# Patient Record
Sex: Female | Born: 1975 | Race: White | Hispanic: No | Marital: Married | Smoking: Current every day smoker
Health system: Southern US, Community
[De-identification: ages and names within clinical notes are randomized; demographics above are authoritative.]

## PROBLEM LIST (undated history)

## (undated) HISTORY — PX: ABDOMINAL HYSTERECTOMY: SHX81

## (undated) HISTORY — PX: PARTIAL HYSTERECTOMY: SHX80

## (undated) HISTORY — PX: KNEE SURGERY: SHX244

---

## 2007-01-08 ENCOUNTER — Emergency Department: Payer: Self-pay | Admitting: Emergency Medicine

## 2007-04-24 ENCOUNTER — Inpatient Hospital Stay: Payer: Self-pay | Admitting: Specialist

## 2007-05-21 ENCOUNTER — Encounter: Payer: Self-pay | Admitting: Specialist

## 2007-05-23 ENCOUNTER — Encounter: Payer: Self-pay | Admitting: Specialist

## 2007-06-23 ENCOUNTER — Encounter: Payer: Self-pay | Admitting: Specialist

## 2007-07-23 ENCOUNTER — Encounter: Payer: Self-pay | Admitting: Specialist

## 2007-08-23 ENCOUNTER — Encounter: Payer: Self-pay | Admitting: Specialist

## 2007-09-04 ENCOUNTER — Emergency Department: Payer: Self-pay | Admitting: Emergency Medicine

## 2007-09-22 ENCOUNTER — Encounter: Payer: Self-pay | Admitting: Specialist

## 2007-10-23 ENCOUNTER — Encounter: Payer: Self-pay | Admitting: Specialist

## 2008-06-16 ENCOUNTER — Emergency Department: Payer: Self-pay | Admitting: Internal Medicine

## 2009-01-19 ENCOUNTER — Ambulatory Visit: Payer: Self-pay | Admitting: Obstetrics and Gynecology

## 2009-01-31 ENCOUNTER — Ambulatory Visit: Payer: Self-pay | Admitting: Obstetrics and Gynecology

## 2017-07-02 ENCOUNTER — Emergency Department: Payer: Self-pay

## 2017-07-02 ENCOUNTER — Emergency Department
Admission: EM | Admit: 2017-07-02 | Discharge: 2017-07-02 | Disposition: A | Discharge status: Home / Self Care | Payer: Self-pay | Attending: Emergency Medicine | Admitting: Emergency Medicine

## 2017-07-02 ENCOUNTER — Encounter: Payer: Self-pay | Admitting: *Deleted

## 2017-07-02 DIAGNOSIS — F17200 Nicotine dependence, unspecified, uncomplicated: Secondary | ICD-10-CM | POA: Insufficient documentation

## 2017-07-02 DIAGNOSIS — R1084 Generalized abdominal pain: Secondary | ICD-10-CM

## 2017-07-02 DIAGNOSIS — K5732 Diverticulitis of large intestine without perforation or abscess without bleeding: Secondary | ICD-10-CM

## 2017-07-02 LAB — COMPREHENSIVE METABOLIC PANEL
ALBUMIN: 4.3 g/dL (ref 3.5–5.0)
ALK PHOS: 60 U/L (ref 38–126)
ALT: 40 U/L (ref 14–54)
ANION GAP: 11 (ref 5–15)
AST: 32 U/L (ref 15–41)
BILIRUBIN TOTAL: 0.6 mg/dL (ref 0.3–1.2)
BUN: 12 mg/dL (ref 6–20)
CALCIUM: 9.7 mg/dL (ref 8.9–10.3)
CO2: 27 mmol/L (ref 22–32)
Chloride: 99 mmol/L — ABNORMAL LOW (ref 101–111)
Creatinine, Ser: 0.65 mg/dL (ref 0.44–1.00)
GFR calc non Af Amer: >60 mL/min (ref >60)
GLUCOSE: 170 mg/dL — AB (ref 65–99)
POTASSIUM: 4 mmol/L (ref 3.5–5.1)
SODIUM: 137 mmol/L (ref 135–145)
TOTAL PROTEIN: 7.7 g/dL (ref 6.5–8.1)

## 2017-07-02 LAB — URINALYSIS, COMPLETE (UACMP) WITH MICROSCOPIC
BACTERIA UA: NONE SEEN
BILIRUBIN URINE: NEGATIVE
GLUCOSE, UA: NEGATIVE mg/dL
HGB URINE DIPSTICK: NEGATIVE
KETONES UR: 5 mg/dL — AB
LEUKOCYTES UA: NEGATIVE
NITRITE: NEGATIVE
PROTEIN: NEGATIVE mg/dL
Specific Gravity, Urine: 1.023 (ref 1.005–1.030)
pH: 6 (ref 5.0–8.0)

## 2017-07-02 LAB — CBC
HEMATOCRIT: 39.1 % (ref 35.0–47.0)
HEMOGLOBIN: 13.1 g/dL (ref 12.0–16.0)
MCH: 30.8 pg (ref 26.0–34.0)
MCHC: 33.5 g/dL (ref 32.0–36.0)
MCV: 91.9 fL (ref 80.0–100.0)
Platelets: 238 10*3/uL (ref 150–440)
RBC: 4.25 MIL/uL (ref 3.80–5.20)
RDW: 17.7 % — ABNORMAL HIGH (ref 11.5–14.5)
WBC: 12.5 10*3/uL — ABNORMAL HIGH (ref 3.6–11.0)

## 2017-07-02 LAB — LIPASE, BLOOD: Lipase: 14 U/L (ref 11–51)

## 2017-07-02 LAB — POCT PREGNANCY, URINE: PREG TEST UR: NEGATIVE

## 2017-07-02 MED ORDER — KETOROLAC TROMETHAMINE 30 MG/ML IJ SOLN
15.0000 mg | INTRAMUSCULAR | Status: AC
  Administered 2017-07-02: 15 mg via INTRAVENOUS
  Filled 2017-07-02: qty 1

## 2017-07-02 MED ORDER — SODIUM CHLORIDE 0.9 % IV BOLUS (SEPSIS)
1000.0000 mL | Freq: Once | INTRAVENOUS | Status: AC
  Administered 2017-07-02: 1000 mL via INTRAVENOUS

## 2017-07-02 MED ORDER — IOPAMIDOL (ISOVUE-300) INJECTION 61%
100.0000 mL | Freq: Once | INTRAVENOUS | Status: AC | PRN
  Administered 2017-07-02: 100 mL via INTRAVENOUS
  Filled 2017-07-02: qty 100

## 2017-07-02 MED ORDER — METRONIDAZOLE 500 MG PO TABS: 500.0000 mg | ORAL_TABLET | Freq: Three times a day (TID) | ORAL | 0 refills | Status: DC

## 2017-07-02 MED ORDER — CIPROFLOXACIN HCL 500 MG PO TABS: 500.0000 mg | ORAL_TABLET | Freq: Two times a day (BID) | ORAL | 0 refills | Status: DC

## 2017-07-02 MED ORDER — ONDANSETRON 4 MG PO TBDP: 4.0000 mg | ORAL_TABLET | Freq: Three times a day (TID) | ORAL | 0 refills | Status: DC | PRN

## 2017-07-02 NOTE — ED Triage Notes (Signed)
PT to ED reporting left sided abd and flank pain since Sunday. PT reports believing she had an ovarian cyst 2 weeks ago that improved with time until pt reports having had sex with her husband on Sunday. PT reports after that she has had 7/10 constant pain. No NVD or fevers reported at home, no vaginal discharge. PT reports tenderness to the upper left abd.

## 2017-07-02 NOTE — ED Notes (Signed)
Pt reports pain left upper abdomen and pressure in lower abdomen.  No n/v/d.  Sx for 2 weeks.  Sx worse 3 days ago after intercourse.  No vag bleeding no dysuria.  Pt alert.

## 2017-07-02 NOTE — ED Provider Notes (Signed)
[facility] Emergency Department Provider Note  ____________________________________________  Time seen: Approximately 8:07 PM  I have reviewed the triage vital signs and the nursing notes.   HISTORY  Chief Complaint Abdominal Pain    HPI Alexis Zhang is a 41 y.o. female who complains of diffuse left-sided abdominal pain that has been somewhat gradual and mild for the past 2 weeks, but worse over the past 2-3 days. No nausea vomiting or diarrhea. No rectal bleeding. No fevers or chills. Radiates to the back, worse with movement, better with staying still. Concerned that it could be an ovarian cyst. No vaginal bleeding or discharge. Feels sharp.     History reviewed. No pertinent past medical history.   There are no active problems to display for this patient.    History reviewed. No pertinent surgical history.   Prior to Admission medications   Medication Sig Start Date End Date Taking? Authorizing Provider  ciprofloxacin (CIPRO) 500 MG tablet Take 1 tablet (500 mg total) by mouth 2 (two) times daily. 07/02/17   Sharman Cheek, MD  metroNIDAZOLE (FLAGYL) 500 MG tablet Take 1 tablet (500 mg total) by mouth 3 (three) times daily. 07/02/17   Sharman Cheek, MD  ondansetron (ZOFRAN ODT) 4 MG disintegrating tablet Take 1 tablet (4 mg total) by mouth every 8 (eight) hours as needed for nausea or vomiting. 07/02/17   Sharman Cheek, MD     Allergies Patient has no allergy information on record.   History reviewed. No pertinent family history.  Social History Social History  Substance Use Topics  . Smoking status: Current Every Day Smoker    Packs/day: 0.50  . Smokeless tobacco: Never Used  . Alcohol use No    Review of Systems  Constitutional:   No fever or chills.  ENT:   No sore throat. No rhinorrhea. Cardiovascular:   No chest pain or syncope. Respiratory:   No dyspnea or cough. Gastrointestinal:   positive as above for  abdominal pain without vomiting and diarrhea.  Musculoskeletal:   Negative for focal pain or swelling All other systems reviewed and are negative except as documented above in ROS and HPI.  ____________________________________________   PHYSICAL EXAM:  VITAL SIGNS: ED Triage Vitals  Enc Vitals Group     BP 07/02/17 1648 (!) 152/102     Pulse Rate 07/02/17 1648 (!) 124     Resp 07/02/17 1648 18     Temp 07/02/17 1648 97.7 F (36.5 C)     Temp Source 07/02/17 1648 Oral     SpO2 07/02/17 1648 96 %     Weight 07/02/17 1649 285 lb (129.3 kg)     Height 07/02/17 1649 5' 11.5" (1.816 m)     Head Circumference --      Peak Flow --      Pain Score 07/02/17 1648 7     Pain Loc --      Pain Edu? --      Excl. in GC? --     Vital signs reviewed, nursing assessments reviewed.   Constitutional:   Alert and oriented. Well appearing and in no distress. Eyes:   No scleral icterus.  EOMI. No nystagmus. No conjunctival pallor. PERRL. ENT   Head:   Normocephalic and atraumatic.   Nose:   No congestion/rhinnorhea.    Mouth/Throat:   MMM, no pharyngeal erythema. No peritonsillar mass.    Neck:   No meningismus. Full ROM Hematological/Lymphatic/Immunilogical:   No cervical lymphadenopathy. Cardiovascular:  RRR. Symmetric bilateral radial and DP pulses.  No murmurs.  Respiratory:   Normal respiratory effort without tachypnea/retractions. Breath sounds are clear and equal bilaterally. No wheezes/rales/rhonchi. Gastrointestinal:   Soft with diffuse left-sided abdominal tenderness. Some suprapubic tenderness as well.. Non distended. There is no CVA tenderness.  No rebound, rigidity, or guarding. Genitourinary:   deferred Musculoskeletal:   Normal range of motion in all extremities. No joint effusions.  No lower extremity tenderness.  No edema. Neurologic:   Normal speech and language.  Motor grossly intact. No gross focal neurologic deficits are appreciated.  Skin:    Skin is warm,  dry and intact. No rash noted.  No petechiae, purpura, or bullae.  ____________________________________________    LABS (pertinent positives/negatives) (all labs ordered are listed, but only abnormal results are displayed) Labs Reviewed  COMPREHENSIVE METABOLIC PANEL - Abnormal; Notable for the following:       Result Value   Chloride 99 (*)    Glucose, Bld 170 (*)    All other components within normal limits  CBC - Abnormal; Notable for the following:    WBC 12.5 (*)    RDW 17.7 (*)    All other components within normal limits  URINALYSIS, COMPLETE (UACMP) WITH MICROSCOPIC - Abnormal; Notable for the following:    Color, Urine YELLOW (*)    APPearance CLEAR (*)    Ketones, ur 5 (*)    Squamous Epithelial / LPF 0-5 (*)    All other components within normal limits  LIPASE, BLOOD  POCT PREGNANCY, URINE   ____________________________________________   EKG    ____________________________________________    RADIOLOGY  Ct Abdomen Pelvis W Contrast  Result Date: 07/02/2017 CLINICAL DATA:  Abdominal pain left flank pain. Ovarian cyst removed 2 weeks ago EXAM: CT ABDOMEN AND PELVIS WITH CONTRAST TECHNIQUE: Multidetector CT imaging of the abdomen and pelvis was performed using the standard protocol following bolus administration of intravenous contrast. CONTRAST:  ISOVUE-300 IOPAMIDOL (ISOVUE-300) INJECTION 61% COMPARISON:  CT abdomen pelvis 01/08/2007 FINDINGS: Lower chest: Lung bases clear Hepatobiliary: Extensive fatty infiltration liver with hepatomegaly. Gallbladder and bile ducts normal Pancreas: Negative Spleen: Negative Adrenals/Urinary Tract: Negative Stomach/Bowel: Mild edema surrounding the sigmoid colon. Scattered small diverticular seen in the area in the findings most consistent with mild acute diverticulitis. No evidence of abscess. Negative for bowel obstruction.  Normal appendix Vascular/Lymphatic: Negative Reproductive: Hysterectomy. No adnexal mass. No free  fluid. Both ovaries are noted in normal in size. Other: None Musculoskeletal: Disc degeneration and spurring L4-5 IMPRESSION: Findings consistent with mild diverticulitis of the sigmoid colon. Negative for abscess. Fatty infiltration throughout the liver with hepatomegaly Electronically Signed   By: Marlan Palau M.D.   On: 07/02/2017 19:09    ____________________________________________   PROCEDURES Procedures  ____________________________________________   INITIAL IMPRESSION / ASSESSMENT AND PLAN / ED COURSE  Pertinent labs & imaging results that were available during my care of the patient were reviewed by me and considered in my medical decision making (see chart for details).  patient well appearing no acute distress. Tachycardic to 120 in triage, normal on my exam. Nontoxic, not septic. Concern for kidney stone versus diverticulitis versus bowel obstruction or internal hernia versus possibly ovarian cyst. Low suspicion of torsion. CT scan was performed which does show mild diverticulitis. On reassessment at 8:00 PM, patient remains well appearing in good spirits and smiling. Counseled on taking ibuprofen and Tylenol for pain relief. Offered very limited prescription of opioid, patient declines. Recommended follow-up with primary care,  follow-up with general surgery if she has recurrences of these symptoms or if it does not resolvedwithin one week.return precautions for worsening of symptoms.  At this point I doubtSTI PID TOA torsion biliary disease AAA obstruction perforation or urinary tract infection. Abdomen is nonsurgical.      ____________________________________________   FINAL CLINICAL IMPRESSION(S) / ED DIAGNOSES  Final diagnoses:  Generalized abdominal pain  Diverticulitis of large intestine without perforation or abscess, unspecified bleeding status      New Prescriptions   CIPROFLOXACIN (CIPRO) 500 MG TABLET    Take 1 tablet (500 mg total) by mouth 2 (two) times  daily.   METRONIDAZOLE (FLAGYL) 500 MG TABLET    Take 1 tablet (500 mg total) by mouth 3 (three) times daily.   ONDANSETRON (ZOFRAN ODT) 4 MG DISINTEGRATING TABLET    Take 1 tablet (4 mg total) by mouth every 8 (eight) hours as needed for nausea or vomiting.     Portions of this note were generated with dragon dictation software. Dictation errors may occur despite best attempts at proofreading.    Sharman CheekStafford, Pricilla Moehle, MD 07/02/17 2012

## 2020-11-17 ENCOUNTER — Encounter: Payer: Self-pay | Admitting: Family Medicine

## 2020-11-17 ENCOUNTER — Other Ambulatory Visit: Payer: Self-pay

## 2020-11-17 ENCOUNTER — Ambulatory Visit: Payer: 59 | Admitting: Family Medicine

## 2020-11-17 VITALS — BP 137/95 | HR 90 | Ht 72.0 in | Wt 263.7 lb

## 2020-11-17 DIAGNOSIS — R030 Elevated blood-pressure reading, without diagnosis of hypertension: Secondary | ICD-10-CM

## 2020-11-17 DIAGNOSIS — K5792 Diverticulitis of intestine, part unspecified, without perforation or abscess without bleeding: Secondary | ICD-10-CM | POA: Insufficient documentation

## 2020-11-17 DIAGNOSIS — F5101 Primary insomnia: Secondary | ICD-10-CM | POA: Diagnosis not present

## 2020-11-17 DIAGNOSIS — Z720 Tobacco use: Secondary | ICD-10-CM | POA: Diagnosis not present

## 2020-11-17 DIAGNOSIS — Z7689 Persons encountering health services in other specified circumstances: Secondary | ICD-10-CM | POA: Diagnosis not present

## 2020-11-17 DIAGNOSIS — E66812 Obesity, class 2: Secondary | ICD-10-CM | POA: Insufficient documentation

## 2020-11-17 DIAGNOSIS — E6609 Other obesity due to excess calories: Secondary | ICD-10-CM

## 2020-11-17 DIAGNOSIS — Z6835 Body mass index (BMI) 35.0-35.9, adult: Secondary | ICD-10-CM

## 2020-11-17 DIAGNOSIS — R252 Cramp and spasm: Secondary | ICD-10-CM

## 2020-11-17 MED ORDER — TRAZODONE HCL 50 MG PO TABS: 25.0000 mg | ORAL_TABLET | Freq: Every evening | ORAL | 1 refills | Status: AC | PRN

## 2020-11-17 NOTE — Progress Notes (Signed)
New Patient Office Visit  Subjective:  Subjective  Patient ID: Alexis Zhang, female    DOB: Jul 28, 1976  Age: 45 y.o. MRN: [id number]  CC:  Chief Complaint  Patient presents with  . New Patient (Initial Visit)    Patient is here to establish care with a new pcp.    HPI Alexis Zhang is a 45 y.o. female presenting today for a new patient evaluation.     History reviewed. No pertinent past medical history.  Past Surgical History:  Procedure Laterality Date  . KNEE SURGERY    . PARTIAL HYSTERECTOMY      Family History  Problem Relation Age of Onset  . Heart disease Mother   . Hypertension Mother   . Thyroid disease Mother   . Diabetes Father   . Diabetes Brother   . Diabetes Maternal Grandfather   . Diabetes Paternal Grandmother     Social History   Socioeconomic History  . Marital status: Married    Spouse name: Not on file  . Number of children: Not on file  . Years of education: Not on file  . Highest education level: Not on file  Occupational History  . Not on file  Tobacco Use  . Smoking status: Current Every Day Smoker    Packs/day: 0.50    Years: 20.00    Pack years: 10.00  . Smokeless tobacco: Never Used  Substance and Sexual Activity  . Alcohol use: No  . Drug use: No  . Sexual activity: Yes  Other Topics Concern  . Not on file  Social History Narrative  . Not on file   Social Determinants of Health   Financial Resource Strain: Not on file  Food Insecurity: Not on file  Transportation Needs: Not on file  Physical Activity: Not on file  Stress: Not on file  Social Connections: Not on file  Intimate Partner Violence: Not on file     Current Outpatient Medications:  .  acetaminophen (TYLENOL) 500 MG tablet, Take 500 mg by mouth daily as needed., Disp: , Rfl:  .  traZODone (DESYREL) 50 MG tablet, Take 0.5-1 tablets (25-50 mg total) by mouth at bedtime as needed for sleep., Disp: 90 tablet, Rfl: 1   No Known  Allergies  ROS Review of Systems  Constitutional: Negative.   HENT: Negative.   Respiratory: Negative.   Cardiovascular: Negative.   Gastrointestinal: Positive for diarrhea.  Endocrine: Negative.   Genitourinary: Negative.   Musculoskeletal:       Frequent muscle cramps.   Neurological: Negative.   Hematological: Negative.   Psychiatric/Behavioral: Negative.       Objective:    Physical Exam Constitutional:      Appearance: She is obese.  HENT:     Head: Normocephalic.     Right Ear: Tympanic membrane normal.     Left Ear: Tympanic membrane normal.     Nose: Nose normal.     Mouth/Throat:     Mouth: Mucous membranes are dry.  Eyes:     Pupils: Pupils are equal, round, and reactive to light.  Cardiovascular:     Rate and Rhythm: Normal rate.     Pulses: Normal pulses.  Pulmonary:     Effort: Pulmonary effort is normal.  Abdominal:     General: Abdomen is flat.  Musculoskeletal:     Cervical back: Normal range of motion.  Skin:    General: Skin is warm.     Capillary Refill: Capillary refill takes less  than 2 seconds.  Neurological:     General: No focal deficit present.     Mental Status: She is alert.  Psychiatric:        Mood and Affect: Mood normal.     BP (!) 137/95   Pulse 90   Ht 6' (1.829 m)   Wt 263 lb 11.2 oz (119.6 kg)   BMI 35.76 kg/m  Wt Readings from Last 3 Encounters:  11/17/20 263 lb 11.2 oz (119.6 kg)  07/02/17 285 lb (129.3 kg)    Health Maintenance Due  Topic Date Due  . Hepatitis C Screening  Never done  . COVID-19 Vaccine (1) Never done  . HIV Screening  Never done  . TETANUS/TDAP  Never done  . INFLUENZA VACCINE  Never done    There are no preventive care reminders to display for this patient.  Laboratory Data: I have reviewed this information for accuracy. CBC Latest Ref Rng & Units 07/02/2017  WBC 3.6 - 11.0 K/uL 12.5(H)  Hemoglobin 12.0 - 16.0 g/dL 19.1  Hematocrit 47.8 - 47.0 % 39.1  Platelets 150 - 440 K/uL 238    CMP Latest Ref Rng & Units 07/02/2017  Glucose 65 - 99 mg/dL 295(A)  BUN 6 - 20 mg/dL 12  Creatinine 2.13 - 0.86 mg/dL 5.78  Sodium 469 - 629 mmol/L 137  Potassium 3.5 - 5.1 mmol/L 4.0  Chloride 101 - 111 mmol/L 99(L)  CO2 22 - 32 mmol/L 27  Calcium 8.9 - 10.3 mg/dL 9.7  Total Protein 6.5 - 8.1 g/dL 7.7  Total Bilirubin 0.3 - 1.2 mg/dL 0.6  Alkaline Phos 38 - 126 U/L 60  AST 15 - 41 U/L 32  ALT 14 - 54 U/L 40    No results found for: TSH Lab Results  Component Value Date   ALBUMIN 4.3 07/02/2017   ANIONGAP 11 07/02/2017   No results found for: CHOL, HDL, LDLCALC, CHOLHDL No results found for: TRIG No results found for: HGBA1C    Assessment & Plan:   Problem List Items Addressed This Visit      Other   Establishing care with new doctor, encounter for - Primary    Patient has not had a PCP in years she says. She came in today to discuss some recent medical issues, Insomnia, Frequent loose BM, Muscle cramps, dx of Diverticulitis at the ER months ago without infection. She smokes 1/2 ppd, denies cough, wheeze or SOB.               Primary insomnia    Patient with long history of insomnia. Says that Melatonin helps her get to sleep but she can't stay asleep. Sometimes she gets less than 4 hours of sleep per night. Plan- Discussed sleep hygiene, rx Trazadone. F/u 1 week.       Diverticulitis    Dx with Diverticulitis months ago with no infection, no recent abd pain, trying to follow no seed diet.       Muscle cramps    Has been having muscle cramps 3-4 times daily, worse at night. Her diet has been consistent but she does report 4-5 loose stools daily. Plan- Discussed the role of electrolytes in muscles and sensible fluid loss with her multiple stools.  Plan- She is to get OTC Potassium and Magnesium for replenishment.       Single episode of elevated blood pressure    She has never been told that she had HTN, Manual BP today was 148/102. Plan- F/u  1 week to re  evaluate HTN, if BP still elevated will start Lisinopril 5 mg, not starting HCTZ due to cramping.       Tobacco abuse    Discussed wether she is ready to stop smoking, she says no at this time, I discussed 1800 QUITNOW with her, and to set a stop date.       Class 2 obesity due to excess calories without serious comorbidity with body mass index (BMI) of 35.0 to 35.9 in adult    - Pt is obesity II = 35-39.9 - activation or motivation to change monitored - activity and exercise based on tolerance encouraged   Discussed weight and low carb diet therapy along with exercise.           Meds ordered this encounter  Medications  . traZODone (DESYREL) 50 MG tablet    Sig: Take 0.5-1 tablets (25-50 mg total) by mouth at bedtime as needed for sleep.    Dispense:  90 tablet    Refill:  1       Follow-up: No follow-ups on file.    Irish Lack, FNP [facility] [address], [place], [place] [address]

## 2020-11-17 NOTE — Assessment & Plan Note (Addendum)
Patient has not had a PCP in years she says. She came in today to discuss some recent medical issues, Insomnia, Frequent loose BM, Muscle cramps, dx of Diverticulitis at the ER months ago without infection. She smokes 1/2 ppd, denies cough, wheeze or SOB.

## 2020-11-17 NOTE — Assessment & Plan Note (Signed)
-   Pt is obesity II = 35-39.9 - activation or motivation to change monitored - activity and exercise based on tolerance encouraged   Discussed weight and low carb diet therapy along with exercise.

## 2020-11-17 NOTE — Assessment & Plan Note (Signed)
Has been having muscle cramps 3-4 times daily, worse at night. Her diet has been consistent but she does report 4-5 loose stools daily. Plan- Discussed the role of electrolytes in muscles and sensible fluid loss with her multiple stools.  Plan- She is to get OTC Potassium and Magnesium for replenishment.

## 2020-11-17 NOTE — Assessment & Plan Note (Signed)
Dx with Diverticulitis months ago with no infection, no recent abd pain, trying to follow no seed diet.

## 2020-11-17 NOTE — Assessment & Plan Note (Signed)
She has never been told that she had HTN, Manual BP today was 148/102. Plan- F/u 1 week to re evaluate HTN, if BP still elevated will start Lisinopril 5 mg, not starting HCTZ due to cramping.

## 2020-11-17 NOTE — Assessment & Plan Note (Addendum)
Patient with long history of insomnia. Says that Melatonin helps her get to sleep but she can't stay asleep. Sometimes she gets less than 4 hours of sleep per night. Plan- Discussed sleep hygiene, rx Trazadone. F/u 1 week.

## 2020-11-17 NOTE — Assessment & Plan Note (Signed)
Discussed wether she is ready to stop smoking, she says no at this time, I discussed 1800 QUITNOW with her, and to set a stop date.

## 2020-11-17 NOTE — Addendum Note (Signed)
Addended by: Melody Comas L on: 11/17/2020 11:03 AM   Modules accepted: Orders

## 2020-11-24 ENCOUNTER — Ambulatory Visit: Payer: 59 | Admitting: Family Medicine

## 2020-12-08 ENCOUNTER — Ambulatory Visit (INDEPENDENT_AMBULATORY_CARE_PROVIDER_SITE_OTHER): Payer: 59 | Admitting: Family Medicine

## 2020-12-08 ENCOUNTER — Other Ambulatory Visit: Payer: Self-pay

## 2020-12-08 ENCOUNTER — Encounter: Payer: Self-pay | Admitting: Family Medicine

## 2020-12-08 VITALS — BP 125/80 | HR 89 | Ht 72.0 in | Wt 262.2 lb

## 2020-12-08 DIAGNOSIS — R030 Elevated blood-pressure reading, without diagnosis of hypertension: Secondary | ICD-10-CM

## 2020-12-08 DIAGNOSIS — Z720 Tobacco use: Secondary | ICD-10-CM | POA: Diagnosis not present

## 2020-12-08 DIAGNOSIS — E6609 Other obesity due to excess calories: Secondary | ICD-10-CM | POA: Diagnosis not present

## 2020-12-08 DIAGNOSIS — F5101 Primary insomnia: Secondary | ICD-10-CM | POA: Diagnosis not present

## 2020-12-08 DIAGNOSIS — Z6835 Body mass index (BMI) 35.0-35.9, adult: Secondary | ICD-10-CM

## 2020-12-08 NOTE — Assessment & Plan Note (Signed)
Tarzadone working well but she still has nights where she has trouble sleeping. Plan- She is to take two Trazodone for sleep and try the 100 mg for now.

## 2020-12-08 NOTE — Progress Notes (Signed)
Established Patient Office Visit  SUBJECTIVE:  Subjective  Patient ID: Alexis Zhang, female    DOB: Jul 23, 1976  Age: 45 y.o. MRN: [id number]  CC:  Chief Complaint  Patient presents with  . Follow-up    Patient is here for a follow up on her blood pressure. At last office visit patient's blood pressure was elevated and she was told to come back to have her bp rechecked today.    HPI Alexis Zhang is a 45 y.o. female presenting today for     History reviewed. No pertinent past medical history.  Past Surgical History:  Procedure Laterality Date  . KNEE SURGERY    . PARTIAL HYSTERECTOMY      Family History  Problem Relation Age of Onset  . Heart disease Mother   . Hypertension Mother   . Thyroid disease Mother   . Diabetes Father   . Diabetes Brother   . Diabetes Maternal Grandfather   . Diabetes Paternal Grandmother     Social History   Socioeconomic History  . Marital status: Married    Spouse name: Not on file  . Number of children: Not on file  . Years of education: Not on file  . Highest education level: Not on file  Occupational History  . Not on file  Tobacco Use  . Smoking status: Current Every Day Smoker    Packs/day: 0.50    Years: 20.00    Pack years: 10.00  . Smokeless tobacco: Never Used  Substance and Sexual Activity  . Alcohol use: No  . Drug use: No  . Sexual activity: Yes  Other Topics Concern  . Not on file  Social History Narrative  . Not on file   Social Determinants of Health   Financial Resource Strain: Not on file  Food Insecurity: Not on file  Transportation Needs: Not on file  Physical Activity: Not on file  Stress: Not on file  Social Connections: Not on file  Intimate Partner Violence: Not on file     Current Outpatient Medications:  .  acetaminophen (TYLENOL) 500 MG tablet, Take 500 mg by mouth daily as needed., Disp: , Rfl:  .  traZODone (DESYREL) 50 MG tablet, Take 0.5-1 tablets (25-50 mg total) by mouth at  bedtime as needed for sleep., Disp: 90 tablet, Rfl: 1   No Known Allergies  ROS Review of Systems  Constitutional: Negative.   HENT: Negative.   Respiratory: Negative.   Cardiovascular: Negative.   Genitourinary: Negative.   Musculoskeletal: Negative.   Psychiatric/Behavioral: Positive for sleep disturbance.     OBJECTIVE:    Physical Exam Constitutional:      Appearance: Normal appearance. She is obese.  HENT:     Right Ear: Tympanic membrane normal.     Left Ear: Tympanic membrane normal.     Nose: Nose normal.     Mouth/Throat:     Mouth: Mucous membranes are moist.  Cardiovascular:     Rate and Rhythm: Normal rate and regular rhythm.  Pulmonary:     Effort: Pulmonary effort is normal.  Musculoskeletal:     Cervical back: Normal range of motion.  Psychiatric:        Mood and Affect: Mood normal.     BP 125/80   Pulse 89   Ht 6' (1.829 m)   Wt 262 lb 3.2 oz (118.9 kg)   BMI 35.56 kg/m  Wt Readings from Last 3 Encounters:  12/08/20 262 lb 3.2 oz (118.9 kg)  11/17/20 263 lb 11.2 oz (119.6 kg)  07/02/17 285 lb (129.3 kg)    Health Maintenance Due  Topic Date Due  . Hepatitis C Screening  Never done  . COVID-19 Vaccine (1) Never done  . HIV Screening  Never done  . TETANUS/TDAP  Never done  . INFLUENZA VACCINE  Never done    There are no preventive care reminders to display for this patient.  CBC Latest Ref Rng & Units 07/02/2017  WBC 3.6 - 11.0 K/uL 12.5(H)  Hemoglobin 12.0 - 16.0 g/dL 81.8  Hematocrit 56.3 - 47.0 % 39.1  Platelets 150 - 440 K/uL 238   CMP Latest Ref Rng & Units 07/02/2017  Glucose 65 - 99 mg/dL 149(F)  BUN 6 - 20 mg/dL 12  Creatinine 0.26 - 3.78 mg/dL 5.88  Sodium 502 - 774 mmol/L 137  Potassium 3.5 - 5.1 mmol/L 4.0  Chloride 101 - 111 mmol/L 99(L)  CO2 22 - 32 mmol/L 27  Calcium 8.9 - 10.3 mg/dL 9.7  Total Protein 6.5 - 8.1 g/dL 7.7  Total Bilirubin 0.3 - 1.2 mg/dL 0.6  Alkaline Phos 38 - 126 U/L 60  AST 15 - 41 U/L 32   ALT 14 - 54 U/L 40    No results found for: TSH Lab Results  Component Value Date   ALBUMIN 4.3 07/02/2017   ANIONGAP 11 07/02/2017   No results found for: CHOL, HDL, LDLCALC, CHOLHDL No results found for: TRIG No results found for: HGBA1C    ASSESSMENT & PLAN:   Problem List Items Addressed This Visit      Other   Primary insomnia    Tarzadone working well but she still has nights where she has trouble sleeping. Plan- She is to take two Trazodone for sleep and try the 100 mg for now.       Single episode of elevated blood pressure - Primary    Blood pressure was wnl today without any intervention so we will continue to monitor her BP, fu 3 months.       Tobacco abuse   Class 2 obesity due to excess calories without serious comorbidity with body mass index (BMI) of 35.0 to 35.9 in adult    Discussed her weight, she lost 96 lbs 2 years ago and has gained 45 back during th Pandemic. We discussed intermittent fasting and low carb with goal of losing 10 lbs in 3 months. Fu 3 months.          No orders of the defined types were placed in this encounter.     Follow-up: No follow-ups on file.    Irish Lack, FNP [facility] [address], [place], [place] [address]

## 2020-12-08 NOTE — Assessment & Plan Note (Signed)
Blood pressure was wnl today without any intervention so we will continue to monitor her BP, fu 3 months.

## 2020-12-08 NOTE — Assessment & Plan Note (Signed)
Discussed her weight, she lost 96 lbs 2 years ago and has gained 45 back during th Pandemic. We discussed intermittent fasting and low carb with goal of losing 10 lbs in 3 months. Fu 3 months.

## 2021-03-09 ENCOUNTER — Other Ambulatory Visit: Payer: Self-pay

## 2021-03-09 ENCOUNTER — Ambulatory Visit: Payer: 59 | Admitting: Family Medicine

## 2021-03-09 ENCOUNTER — Encounter: Payer: Self-pay | Admitting: Family Medicine

## 2021-03-09 VITALS — BP 140/86 | HR 75 | Ht 71.5 in | Wt 244.9 lb

## 2021-03-09 DIAGNOSIS — K591 Functional diarrhea: Secondary | ICD-10-CM | POA: Insufficient documentation

## 2021-03-09 NOTE — Progress Notes (Signed)
Established Patient Office Visit  SUBJECTIVE:  Subjective  Patient ID: Alexis Zhang, female    DOB: 1976/01/08  Age: 45 y.o. MRN: [id number]  CC:  Chief Complaint  Patient presents with  . Abdominal Pain    Patient complains of abdominal pain, loss of appetite, diarrhea and abdominal cramping for the past 3 to 4 weeks.      HPI Alexis Zhang is a 45 y.o. female presenting today for 3 months of loose stool and diarrhea up to 20 x a day. It is currently 9:20 am and she has had 4 loose stools. She has changed her diet to include more fiber and cut out a lot of the fast foods that she has been eating. She has lost 20 lbs in 3 months since last visit. She reports being dx with diverticulitis in 2018 from CT ABD.   History reviewed. No pertinent past medical history.  Past Surgical History:  Procedure Laterality Date  . KNEE SURGERY    . PARTIAL HYSTERECTOMY      Family History  Problem Relation Age of Onset  . Heart disease Mother   . Hypertension Mother   . Thyroid disease Mother   . Diabetes Father   . Diabetes Brother   . Diabetes Maternal Grandfather   . Diabetes Paternal Grandmother     Social History   Socioeconomic History  . Marital status: Married    Spouse name: Not on file  . Number of children: Not on file  . Years of education: Not on file  . Highest education level: Not on file  Occupational History  . Not on file  Tobacco Use  . Smoking status: Current Every Day Smoker    Packs/day: 0.50    Years: 20.00    Pack years: 10.00  . Smokeless tobacco: Never Used  Substance and Sexual Activity  . Alcohol use: No  . Drug use: No  . Sexual activity: Yes  Other Topics Concern  . Not on file  Social History Narrative  . Not on file   Social Determinants of Health   Financial Resource Strain: Not on file  Food Insecurity: Not on file  Transportation Needs: Not on file  Physical Activity: Not on file  Stress: Not on file  Social Connections: Not  on file  Intimate Partner Violence: Not on file     Current Outpatient Medications:  .  acetaminophen (TYLENOL) 500 MG tablet, Take 500 mg by mouth daily as needed., Disp: , Rfl:  .  traZODone (DESYREL) 50 MG tablet, Take 0.5-1 tablets (25-50 mg total) by mouth at bedtime as needed for sleep., Disp: 90 tablet, Rfl: 1   No Known Allergies  ROS Review of Systems  Constitutional: Negative.   HENT: Negative.   Gastrointestinal: Positive for abdominal pain and diarrhea.  Genitourinary: Negative.   Musculoskeletal: Negative.   Neurological: Negative.   Psychiatric/Behavioral: Negative.      OBJECTIVE:    Physical Exam Vitals and nursing note reviewed.  Constitutional:      Appearance: She is obese. She is not ill-appearing.  HENT:     Head: Normocephalic.     Mouth/Throat:     Mouth: Mucous membranes are moist.  Cardiovascular:     Rate and Rhythm: Normal rate and regular rhythm.     Heart sounds: Normal heart sounds.  Abdominal:     General: Bowel sounds are normal.  Skin:    General: Skin is warm.     BP 140/86  Pulse 75   Ht 5' 11.5" (1.816 m)   Wt 244 lb 14.4 oz (111.1 kg)   BMI 33.68 kg/m  Wt Readings from Last 3 Encounters:  03/09/21 244 lb 14.4 oz (111.1 kg)  12/08/20 262 lb 3.2 oz (118.9 kg)  11/17/20 263 lb 11.2 oz (119.6 kg)    Health Maintenance Due  Topic Date Due  . COVID-19 Vaccine (1) Never done  . HIV Screening  Never done  . Hepatitis C Screening  Never done  . TETANUS/TDAP  Never done    There are no preventive care reminders to display for this patient.  CBC Latest Ref Rng & Units 07/02/2017  WBC 3.6 - 11.0 K/uL 12.5(H)  Hemoglobin 12.0 - 16.0 g/dL 53.7  Hematocrit 48.2 - 47.0 % 39.1  Platelets 150 - 440 K/uL 238   CMP Latest Ref Rng & Units 07/02/2017  Glucose 65 - 99 mg/dL 707(E)  BUN 6 - 20 mg/dL 12  Creatinine 6.75 - 4.49 mg/dL 2.01  Sodium 007 - 121 mmol/L 137  Potassium 3.5 - 5.1 mmol/L 4.0  Chloride 101 - 111 mmol/L 99(L)   CO2 22 - 32 mmol/L 27  Calcium 8.9 - 10.3 mg/dL 9.7  Total Protein 6.5 - 8.1 g/dL 7.7  Total Bilirubin 0.3 - 1.2 mg/dL 0.6  Alkaline Phos 38 - 126 U/L 60  AST 15 - 41 U/L 32  ALT 14 - 54 U/L 40    No results found for: TSH Lab Results  Component Value Date   ALBUMIN 4.3 07/02/2017   ANIONGAP 11 07/02/2017   No results found for: CHOL, HDL, LDLCALC, CHOLHDL No results found for: TRIG No results found for: HGBA1C    ASSESSMENT & PLAN:   Problem List Items Addressed This Visit      Digestive   Functional diarrhea - Primary    Alexis Zhang is a 45 y.o. female presenting today for 3 months of loose stool and diarrhea up to 20 x a day. It is currently 9:20 am and she has had 4 loose stools. She has changed her diet to include more fiber and cut out a lot of the fast foods that she has been eating. She has lost 20 lbs in 3 months since last visit. She reports being dx with diverticulitis in 2018 from CT ABD.  A/P- Exam grossly normal today, Will do labs and refer to GI for evaluation and likely colonoscopy.           No orders of the defined types were placed in this encounter.    Follow-up: No follow-ups on file.    Irish Lack, FNP [facility] [address] [place], [place] [address]

## 2021-03-09 NOTE — Assessment & Plan Note (Signed)
Alexis Zhang is a 45 y.o. female presenting today for 3 months of loose stool and diarrhea up to 20 x a day. It is currently 9:20 am and she has had 4 loose stools. She has changed her diet to include more fiber and cut out a lot of the fast foods that she has been eating. She has lost 20 lbs in 3 months since last visit. She reports being dx with diverticulitis in 2018 from CT ABD.  A/P- Exam grossly normal today, Will do labs and refer to GI for evaluation and likely colonoscopy.

## 2021-03-10 LAB — COMPLETE METABOLIC PANEL WITH GFR
AG Ratio: 1.8 (calc) (ref 1.0–2.5)
ALT: 13 U/L (ref 6–29)
AST: 11 U/L (ref 10–30)
Albumin: 4.4 g/dL (ref 3.6–5.1)
Alkaline phosphatase (APISO): 56 U/L (ref 31–125)
BUN: 15 mg/dL (ref 7–25)
CO2: 25 mmol/L (ref 20–32)
Calcium: 9.6 mg/dL (ref 8.6–10.2)
Chloride: 102 mmol/L (ref 98–110)
Creat: 0.77 mg/dL (ref 0.50–1.10)
GFR, Est African American: 110 mL/min/{1.73_m2} (ref >=60)
GFR, Est Non African American: 95 mL/min/{1.73_m2} (ref >=60)
Globulin: 2.4 g/dL (calc) (ref 1.9–3.7)
Glucose, Bld: 126 mg/dL — ABNORMAL HIGH (ref 65–99)
Potassium: 4.7 mmol/L (ref 3.5–5.3)
Sodium: 138 mmol/L (ref 135–146)
Total Bilirubin: 0.3 mg/dL (ref 0.2–1.2)
Total Protein: 6.8 g/dL (ref 6.1–8.1)

## 2021-03-10 LAB — CBC WITH DIFFERENTIAL/PLATELET
Absolute Monocytes: 571 cells/uL (ref 200–950)
Basophils Absolute: 41 cells/uL (ref 0–200)
Basophils Relative: 0.4 %
Eosinophils Absolute: 102 cells/uL (ref 15–500)
Eosinophils Relative: 1 %
HCT: 42.6 % (ref 35.0–45.0)
Hemoglobin: 14.1 g/dL (ref 11.7–15.5)
Lymphs Abs: 2244 cells/uL (ref 850–3900)
MCH: 30.7 pg (ref 27.0–33.0)
MCHC: 33.1 g/dL (ref 32.0–36.0)
MCV: 92.8 fL (ref 80.0–100.0)
MPV: 9.8 fL (ref 7.5–12.5)
Monocytes Relative: 5.6 %
Neutro Abs: 7242 cells/uL (ref 1500–7800)
Neutrophils Relative %: 71 %
Platelets: 261 10*3/uL (ref 140–400)
RBC: 4.59 10*6/uL (ref 3.80–5.10)
RDW: 13.3 % (ref 11.0–15.0)
Total Lymphocyte: 22 %
WBC: 10.2 10*3/uL (ref 3.8–10.8)

## 2021-03-10 LAB — SEDIMENTATION RATE: Sed Rate: 11 mm/h (ref 0–20)

## 2021-03-13 ENCOUNTER — Encounter: Payer: Self-pay | Admitting: *Deleted

## 2021-03-22 ENCOUNTER — Other Ambulatory Visit: Payer: Self-pay | Admitting: Obstetrics and Gynecology

## 2021-03-22 DIAGNOSIS — Z1231 Encounter for screening mammogram for malignant neoplasm of breast: Secondary | ICD-10-CM

## 2021-03-29 ENCOUNTER — Other Ambulatory Visit: Payer: Self-pay

## 2021-03-29 ENCOUNTER — Ambulatory Visit
Admission: RE | Admit: 2021-03-29 | Discharge: 2021-03-29 | Disposition: A | Discharge status: Home / Self Care | Payer: 59 | Source: Ambulatory Visit | Attending: Obstetrics and Gynecology | Admitting: Obstetrics and Gynecology

## 2021-03-29 DIAGNOSIS — Z1231 Encounter for screening mammogram for malignant neoplasm of breast: Secondary | ICD-10-CM | POA: Insufficient documentation

## 2021-04-04 ENCOUNTER — Other Ambulatory Visit: Payer: Self-pay | Admitting: Obstetrics and Gynecology

## 2021-04-04 DIAGNOSIS — N6489 Other specified disorders of breast: Secondary | ICD-10-CM

## 2021-04-04 DIAGNOSIS — R928 Other abnormal and inconclusive findings on diagnostic imaging of breast: Secondary | ICD-10-CM

## 2021-04-12 ENCOUNTER — Other Ambulatory Visit: Payer: Self-pay

## 2021-04-12 ENCOUNTER — Ambulatory Visit
Admission: RE | Admit: 2021-04-12 | Discharge: 2021-04-12 | Disposition: A | Discharge status: Home / Self Care | Payer: 59 | Source: Ambulatory Visit | Attending: Obstetrics and Gynecology | Admitting: Obstetrics and Gynecology

## 2021-04-12 DIAGNOSIS — R928 Other abnormal and inconclusive findings on diagnostic imaging of breast: Secondary | ICD-10-CM | POA: Diagnosis present

## 2021-04-12 DIAGNOSIS — N6489 Other specified disorders of breast: Secondary | ICD-10-CM | POA: Diagnosis present

## 2021-04-13 ENCOUNTER — Ambulatory Visit: Payer: 59 | Admitting: Gastroenterology

## 2021-04-13 ENCOUNTER — Other Ambulatory Visit: Payer: Self-pay

## 2021-04-13 ENCOUNTER — Encounter: Payer: Self-pay | Admitting: Gastroenterology

## 2021-04-13 VITALS — BP 142/87 | HR 87 | Temp 97.8°F | Ht 72.0 in | Wt 246.0 lb

## 2021-04-13 DIAGNOSIS — K9049 Malabsorption due to intolerance, not elsewhere classified: Secondary | ICD-10-CM

## 2021-04-13 DIAGNOSIS — Z8719 Personal history of other diseases of the digestive system: Secondary | ICD-10-CM | POA: Diagnosis not present

## 2021-04-13 DIAGNOSIS — R197 Diarrhea, unspecified: Secondary | ICD-10-CM

## 2021-04-13 MED ORDER — NA SULFATE-K SULFATE-MG SULF 17.5-3.13-1.6 GM/177ML PO SOLN: 354.0000 mL | Freq: Once | ORAL | 0 refills | Status: AC

## 2021-04-13 MED ORDER — SUTAB 1479-225-188 MG PO TABS: ORAL_TABLET | ORAL | 0 refills | Status: AC

## 2021-04-13 NOTE — Addendum Note (Signed)
Addended by: Adela Ports on: 04/13/2021 02:20 PM   Modules accepted: Orders

## 2021-04-13 NOTE — Progress Notes (Signed)
Wyline Mood MD, MRCP(U.K) [address]  [address]  [place], [place] [address]  Main: [phone]  Fax: [phone]   Gastroenterology Consultation  Referring Provider:     Irish Lack, FNP Primary Care Physician:  Irish Lack, FNP Primary Gastroenterologist:  Dr. Wyline Mood  Reason for Consultation:     diarrhea         HPI:   Alexis Zhang is a 45 y.o. y/o female referred for consultation & management  by Irish Lack, FNP.     History of left-sided colonic diverticulitis per her history in 2018 confirmed on a CT scan when she presented emergency room was not told she requires a colonoscopy after that.  Since then has been having diarrhea Diarrhea :  Onset: 2018 Number of bowel movements a day : Has weeks when she has normal bowel movements and weeks when she has diarrhea up to 3-4 bowel movements a day Color : Often mixed with blood Consistency: Ranging from soft pudding-like consistency to watery Present status: Not ongoing today Shape of stool: No change Weight loss: None Prior colonoscopy: None Artificial sugars/sodas/chewing gum: Yes consumes sweet and low and diet soda on a regular basis Bloating: No Gas: No Antibiotic use: No but uses probiotics She says she is not able to tolerate red meat causes significant abdominal symptoms when she eats steak, uses BC powder on a daily basis for neck pain.   No past medical history on file.  Past Surgical History:  Procedure Laterality Date   KNEE SURGERY     PARTIAL HYSTERECTOMY      Prior to Admission medications   Medication Sig Start Date End Date Taking? Authorizing Provider  acetaminophen (TYLENOL) 500 MG tablet Take 500 mg by mouth daily as needed.    [provider]  estradiol (ESTRACE) 0.1 MG/GM vaginal cream 1/4 app per vagina 1-2 times weekly 03/29/21   [provider]  gabapentin (NEURONTIN) 300 MG capsule Take 1 tablet on day 1,  Then take 2 tablets on day 2, Then take 3  tablets on day 3 and every day after that. 04/08/20   [provider]  testosterone cypionate (DEPOTESTOSTERONE CYPIONATE) 200 MG/ML injection Inject 1 mL into the muscle. 03/31/21   [provider]  traZODone (DESYREL) 50 MG tablet Take 0.5-1 tablets (25-50 mg total) by mouth at bedtime as needed for sleep. 11/17/20   Irish Lack, FNP    Family History  Problem Relation Age of Onset   Heart disease Mother    Hypertension Mother    Thyroid disease Mother    Diabetes Father    Diabetes Brother    Diabetes Maternal Grandfather    Diabetes Paternal Grandmother    Breast cancer Other        Mgreat-aunt     Social History   Tobacco Use   Smoking status: Every Day    Packs/day: 0.50    Years: 20.00    Pack years: 10.00    Types: Cigarettes   Smokeless tobacco: Never  Substance Use Topics   Alcohol use: No   Drug use: No    Allergies as of 04/13/2021   (No Known Allergies)    Review of Systems:    All systems reviewed and negative except where noted in HPI.   Physical Exam:  There were no vitals taken for this visit. No LMP recorded. Patient has had a hysterectomy. Psych:  Alert and cooperative. Normal mood and affect. General:   Alert,  Well-developed, well-nourished, pleasant and cooperative in NAD Head:  Normocephalic and atraumatic. Eyes:  Sclera clear, no icterus.   Conjunctiva pink. Ears:  Normal auditory acuity. Lungs:  Respirations even and unlabored.  Clear throughout to auscultation.   No wheezes, crackles, or rhonchi. No acute distress. Heart:  Regular rate and rhythm; no murmurs, clicks, rubs, or gallops. Abdomen:  Normal bowel sounds.  No bruits.  Soft, non-tender and non-distended without masses, hepatosplenomegaly or hernias noted.  No guarding or rebound tenderness.    Neurologic:  Alert and oriented x3;  grossly normal neurologically. Psych:  Alert and cooperative. Normal mood and affect.  Imaging Studies: US BREAST LTD UNI LEFT INC  AXILLA  Result Date: 04/12/2021 CLINICAL DATA:  Callback for bilateral asymmetries.  Baseline exam. EXAM: DIGITAL DIAGNOSTIC BILATERAL MAMMOGRAM WITH TOMOSYNTHESIS AND CAD; ULTRASOUND LEFT BREAST LIMITED; ULTRASOUND RIGHT BREAST LIMITED TECHNIQUE: Bilateral digital diagnostic mammography and breast tomosynthesis was performed. The images were evaluated with computer-aided detection.; Targeted ultrasound examination of the left breast was performed; Targeted ultrasound examination of the right breast was performed COMPARISON:  Previous baseline exam. ACR Breast Density Category c: The breast tissue is heterogeneously dense, which may obscure small masses. FINDINGS: Spot compression tomosynthesis views confirm persistence of an oval circumscribed mass in the RIGHT upper outer breast at middle depth. No additional suspicious findings noted within the RIGHT breast. Spot compression tomosynthesis views confirm persistence of an oval circumscribed mass with suggestion of layering calcifications in the upper breast at middle depth. This is best seen on ML slice 57. Second questioned asymmetry in the inner breast resolves with additional views, consistent with overlapping tissue. On physical exam, no suspicious mass is appreciated. Targeted ultrasound was performed of the RIGHT upper outer breast. At 11 o'clock 4 cm from the nipple, there is an oval circumscribed anechoic mass with posterior acoustic enhancement. It has several thin internal septations. It measures 10 by 8 x 7 mm and is consistent with a benign cluster of cysts. This corresponds to the site of screening mammographic concern. Targeted ultrasound was performed of the LEFT upper breast. At 12 o'clock 9 cm from the nipple, there is an oval circumscribed anechoic mass with posterior acoustic enhancement and several thin internal septations. It measures 7 x 3 by 8 mm and is consistent with a benign cluster of cysts. This corresponds to the site of screening  mammographic concern. IMPRESSION: There are benign bilateral cysts at the sites of screening mammographic concern. No mammographic or sonographic evidence of malignancy RECOMMENDATION: Screening mammogram in one year.(Code:SM-B-01Y) I have discussed the findings and recommendations with the patient. If applicable, a reminder letter will be sent to the patient regarding the next appointment. BI-RADS CATEGORY  2: Benign. Electronically Signed   By: Meda KlinefelterStephanie  Peacock MD   On: 04/12/2021 15:11  US BREAST LTD UNI RIGHT INC AXILLA  Result Date: 04/12/2021 CLINICAL DATA:  Callback for bilateral asymmetries.  Baseline exam. EXAM: DIGITAL DIAGNOSTIC BILATERAL MAMMOGRAM WITH TOMOSYNTHESIS AND CAD; ULTRASOUND LEFT BREAST LIMITED; ULTRASOUND RIGHT BREAST LIMITED TECHNIQUE: Bilateral digital diagnostic mammography and breast tomosynthesis was performed. The images were evaluated with computer-aided detection.; Targeted ultrasound examination of the left breast was performed; Targeted ultrasound examination of the right breast was performed COMPARISON:  Previous baseline exam. ACR Breast Density Category c: The breast tissue is heterogeneously dense, which may obscure small masses. FINDINGS: Spot compression tomosynthesis views confirm persistence of an oval circumscribed mass in the RIGHT upper outer breast at middle depth. No additional suspicious findings  noted within the RIGHT breast. Spot compression tomosynthesis views confirm persistence of an oval circumscribed mass with suggestion of layering calcifications in the upper breast at middle depth. This is best seen on ML slice 57. Second questioned asymmetry in the inner breast resolves with additional views, consistent with overlapping tissue. On physical exam, no suspicious mass is appreciated. Targeted ultrasound was performed of the RIGHT upper outer breast. At 11 o'clock 4 cm from the nipple, there is an oval circumscribed anechoic mass with posterior acoustic  enhancement. It has several thin internal septations. It measures 10 by 8 x 7 mm and is consistent with a benign cluster of cysts. This corresponds to the site of screening mammographic concern. Targeted ultrasound was performed of the LEFT upper breast. At 12 o'clock 9 cm from the nipple, there is an oval circumscribed anechoic mass with posterior acoustic enhancement and several thin internal septations. It measures 7 x 3 by 8 mm and is consistent with a benign cluster of cysts. This corresponds to the site of screening mammographic concern. IMPRESSION: There are benign bilateral cysts at the sites of screening mammographic concern. No mammographic or sonographic evidence of malignancy RECOMMENDATION: Screening mammogram in one year.(Code:SM-B-01Y) I have discussed the findings and recommendations with the patient. If applicable, a reminder letter will be sent to the patient regarding the next appointment. BI-RADS CATEGORY  2: Benign. Electronically Signed   By: Meda Klinefelter MD   On: 04/12/2021 15:11  MM DIAG BREAST TOMO BILATERAL  Result Date: 04/12/2021 CLINICAL DATA:  Callback for bilateral asymmetries.  Baseline exam. EXAM: DIGITAL DIAGNOSTIC BILATERAL MAMMOGRAM WITH TOMOSYNTHESIS AND CAD; ULTRASOUND LEFT BREAST LIMITED; ULTRASOUND RIGHT BREAST LIMITED TECHNIQUE: Bilateral digital diagnostic mammography and breast tomosynthesis was performed. The images were evaluated with computer-aided detection.; Targeted ultrasound examination of the left breast was performed; Targeted ultrasound examination of the right breast was performed COMPARISON:  Previous baseline exam. ACR Breast Density Category c: The breast tissue is heterogeneously dense, which may obscure small masses. FINDINGS: Spot compression tomosynthesis views confirm persistence of an oval circumscribed mass in the RIGHT upper outer breast at middle depth. No additional suspicious findings noted within the RIGHT breast. Spot compression  tomosynthesis views confirm persistence of an oval circumscribed mass with suggestion of layering calcifications in the upper breast at middle depth. This is best seen on ML slice 57. Second questioned asymmetry in the inner breast resolves with additional views, consistent with overlapping tissue. On physical exam, no suspicious mass is appreciated. Targeted ultrasound was performed of the RIGHT upper outer breast. At 11 o'clock 4 cm from the nipple, there is an oval circumscribed anechoic mass with posterior acoustic enhancement. It has several thin internal septations. It measures 10 by 8 x 7 mm and is consistent with a benign cluster of cysts. This corresponds to the site of screening mammographic concern. Targeted ultrasound was performed of the LEFT upper breast. At 12 o'clock 9 cm from the nipple, there is an oval circumscribed anechoic mass with posterior acoustic enhancement and several thin internal septations. It measures 7 x 3 by 8 mm and is consistent with a benign cluster of cysts. This corresponds to the site of screening mammographic concern. IMPRESSION: There are benign bilateral cysts at the sites of screening mammographic concern. No mammographic or sonographic evidence of malignancy RECOMMENDATION: Screening mammogram in one year.(Code:SM-B-01Y) I have discussed the findings and recommendations with the patient. If applicable, a reminder letter will be sent to the patient regarding the next appointment. BI-RADS  CATEGORY  2: Benign. Electronically Signed   By: Meda Klinefelter MD   On: 04/12/2021 15:11  MM 3D SCREEN BREAST BILATERAL  Result Date: 03/30/2021 CLINICAL DATA:  Screening. EXAM: DIGITAL SCREENING BILATERAL MAMMOGRAM WITH TOMOSYNTHESIS AND CAD TECHNIQUE: Bilateral screening digital craniocaudal and mediolateral oblique mammograms were obtained. Bilateral screening digital breast tomosynthesis was performed. The images were evaluated with computer-aided detection. COMPARISON:  None.   Baseline ACR Breast Density Category c: The breast tissue is heterogeneously dense, which may obscure small masses. FINDINGS: In the right breast a possible focal asymmetry warrants further evaluation. In the left breast, a possible focal asymmetry and possible asymmetry warrant further evaluation. IMPRESSION: Further evaluation is suggested for possible focal asymmetry in the right breast. Further evaluation is suggested for possible focal asymmetry and asymmetry in the left breast. RECOMMENDATION: Diagnostic mammogram and possibly ultrasound of both breasts. (Code:[id number]-77M) The patient will be contacted regarding the findings, and additional imaging will be scheduled. BI-RADS CATEGORY  0: Incomplete. Need additional imaging evaluation and/or prior mammograms for comparison. Electronically Signed   By: Meda Klinefelter MD   On: 03/30/2021 10:46   Assessment and Plan:   Alexis Zhang is a 45 y.o. y/o female recall she had an episode of CT confirmed diverticulitis of the colon in 2018, did not have a follow-up colonoscopy, since then having diarrhea multiple times a day on and off associated with blood in the stool on multiple occasions.  Does use BC powders on a daily basis and artificial sugars in her diet.  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  Differentials include NSAID versus inflammatory bowel disease related colitis, rectal bleeding could also be due to hemorrhoids.  We need to rule out alpha gal as a cause of red meat intolerance.  Plan 1.  Obtain CBC, CMP, CRP baseline 2.  Obtain alpha gal panel 3.  Diagnostic colonoscopy 4.  High-fiber diet 25 to 30 g of fiber per day patient information provided 5.  Avoid all artificial sugars and sweeteners   I have discussed alternative options, risks & benefits,  which include, but are not limited to, bleeding, infection, perforation,respiratory complication & drug reaction.  The patient agrees with this plan & written consent will be obtained.      Follow up in 6 weeks   Dr Wyline Mood MD,MRCP(U.K)

## 2021-04-13 NOTE — Patient Instructions (Signed)
High-Fiber Eating Plan °Fiber, also called dietary fiber, is a type of carbohydrate. It is found foods such as fruits, vegetables, whole grains, and beans. A high-fiber diet can have many health benefits. Your health care provider may recommend a high-fiber diet to help: °Prevent constipation. Fiber can make your bowel movements more regular. °Lower your cholesterol. °Relieve the following conditions: °Inflammation of veins in the anus (hemorrhoids). °Inflammation of specific areas of the digestive tract (uncomplicated diverticulosis). °A problem of the large intestine, also called the colon, that sometimes causes pain and diarrhea (irritable bowel syndrome, or IBS). °Prevent overeating as part of a weight-loss plan. °Prevent heart disease, type 2 diabetes, and certain cancers. °What are tips for following this plan? °Reading food labels ° °Check the nutrition facts label on food products for the amount of dietary fiber. Choose foods that have 5 grams of fiber or more per serving. °The goals for recommended daily fiber intake include: °Men (age 50 or younger): 34-38 g. °Men (over age 50): 28-34 g. °Women (age 50 or younger): 25-28 g. °Women (over age 50): 22-25 g. °Your daily fiber goal is _____________ g. °Shopping °Choose whole fruits and vegetables instead of processed forms, such as apple juice or applesauce. °Choose a wide variety of high-fiber foods such as avocados, lentils, oats, and kidney beans. °Read the nutrition facts label of the foods you choose. Be aware of foods with added fiber. These foods often have high sugar and sodium amounts per serving. °Cooking °Use whole-grain flour for baking and cooking. °Cook with brown rice instead of white rice. °Meal planning °Start the day with a breakfast that is high in fiber, such as a cereal that contains 5 g of fiber or more per serving. °Eat breads and cereals that are made with whole-grain flour instead of refined flour or white flour. °Eat brown rice, bulgur  wheat, or millet instead of white rice. °Use beans in place of meat in soups, salads, and pasta dishes. °Be sure that half of the grains you eat each day are whole grains. °General information °You can get the recommended daily intake of dietary fiber by: °Eating a variety of fruits, vegetables, grains, nuts, and beans. °Taking a fiber supplement if you are not able to take in enough fiber in your diet. It is better to get fiber through food than from a supplement. °Gradually increase how much fiber you consume. If you increase your intake of dietary fiber too quickly, you may have bloating, cramping, or gas. °Drink plenty of water to help you digest fiber. °Choose high-fiber snacks, such as berries, raw vegetables, nuts, and popcorn. °What foods should I eat? °Fruits °Berries. Pears. Apples. Oranges. Avocado. Prunes and raisins. Dried figs. °Vegetables °Sweet potatoes. Spinach. Kale. Artichokes. Cabbage. Broccoli. Cauliflower. Green peas. Carrots. Squash. °Grains °Whole-grain breads. Multigrain cereal. Oats and oatmeal. Brown rice. Barley. Bulgur wheat. Millet. Quinoa. Bran muffins. Popcorn. Rye wafer crackers. °Meats and other proteins °Navy beans, kidney beans, and pinto beans. Soybeans. Split peas. Lentils. Nuts and seeds. °Dairy °Fiber-fortified yogurt. °Beverages °Fiber-fortified soy milk. Fiber-fortified orange juice. °Other foods °Fiber bars. °The items listed above may not be a complete list of recommended foods and beverages. Contact a dietitian for more information. °What foods should I avoid? °Fruits °Fruit juice. Cooked, strained fruit. °Vegetables °Fried potatoes. Canned vegetables. Well-cooked vegetables. °Grains °White bread. Pasta made with refined flour. White rice. °Meats and other proteins °Fatty cuts of meat. Fried chicken or fried fish. °Dairy °Milk. Yogurt. Cream cheese. Sour cream. °Fats and   oils °Butters. °Beverages °Soft drinks. °Other foods °Cakes and pastries. °The items listed above may  not be a complete list of foods and beverages to avoid. Talk with your dietitian about what choices are best for you. °Summary °Fiber is a type of carbohydrate. It is found in foods such as fruits, vegetables, whole grains, and beans. °A high-fiber diet has many benefits. It can help to prevent constipation, lower blood cholesterol, aid weight loss, and reduce your risk of heart disease, diabetes, and certain cancers. °Increase your intake of fiber gradually. Increasing fiber too quickly may cause cramping, bloating, and gas. Drink plenty of water while you increase the amount of fiber you consume. °The best sources of fiber include whole fruits and vegetables, whole grains, nuts, seeds, and beans. °This information is not intended to replace advice given to you by your health care provider. Make sure you discuss any questions you have with your health care provider. °Document Revised: 02/11/2020 Document Reviewed: 02/11/2020 °Elsevier Patient Education © 2022 [employer] ° °

## 2021-04-17 ENCOUNTER — Encounter: Payer: Self-pay | Admitting: Gastroenterology

## 2021-04-18 ENCOUNTER — Encounter
Admission: RE | Disposition: A | Discharge status: Home / Self Care | Payer: Self-pay | Source: Home / Self Care | Attending: Gastroenterology

## 2021-04-18 ENCOUNTER — Other Ambulatory Visit: Payer: Self-pay

## 2021-04-18 ENCOUNTER — Ambulatory Visit
Admission: RE | Admit: 2021-04-18 | Discharge: 2021-04-18 | Disposition: A | Discharge status: Home / Self Care | Payer: 59 | Attending: Gastroenterology | Admitting: Gastroenterology

## 2021-04-18 ENCOUNTER — Encounter: Payer: Self-pay | Admitting: Gastroenterology

## 2021-04-18 ENCOUNTER — Ambulatory Visit: Payer: 59 | Admitting: Anesthesiology

## 2021-04-18 DIAGNOSIS — K625 Hemorrhage of anus and rectum: Secondary | ICD-10-CM | POA: Diagnosis not present

## 2021-04-18 DIAGNOSIS — K640 First degree hemorrhoids: Secondary | ICD-10-CM | POA: Diagnosis not present

## 2021-04-18 DIAGNOSIS — R197 Diarrhea, unspecified: Secondary | ICD-10-CM

## 2021-04-18 DIAGNOSIS — K5730 Diverticulosis of large intestine without perforation or abscess without bleeding: Secondary | ICD-10-CM | POA: Insufficient documentation

## 2021-04-18 HISTORY — PX: COLONOSCOPY WITH PROPOFOL: SHX5780

## 2021-04-18 SURGERY — COLONOSCOPY WITH PROPOFOL
Anesthesia: General

## 2021-04-18 MED ORDER — MIDAZOLAM HCL 2 MG/2ML IJ SOLN
INTRAMUSCULAR | Status: AC
  Filled 2021-04-18: qty 2

## 2021-04-18 MED ORDER — PROPOFOL 10 MG/ML IV BOLUS
INTRAVENOUS | Status: DC | PRN
  Administered 2021-04-18: 70 mg via INTRAVENOUS
  Administered 2021-04-18: 30 mg via INTRAVENOUS

## 2021-04-18 MED ORDER — PHENYLEPHRINE HCL (PRESSORS) 10 MG/ML IV SOLN
INTRAVENOUS | Status: AC
  Filled 2021-04-18: qty 1

## 2021-04-18 MED ORDER — PROPOFOL 500 MG/50ML IV EMUL
INTRAVENOUS | Status: AC
  Filled 2021-04-18: qty 150

## 2021-04-18 MED ORDER — LIDOCAINE HCL (PF) 2 % IJ SOLN
INTRAMUSCULAR | Status: AC
  Filled 2021-04-18: qty 5

## 2021-04-18 MED ORDER — PROPOFOL 10 MG/ML IV BOLUS
INTRAVENOUS | Status: AC
  Filled 2021-04-18: qty 20

## 2021-04-18 MED ORDER — MIDAZOLAM HCL 5 MG/5ML IJ SOLN
INTRAMUSCULAR | Status: DC | PRN
  Administered 2021-04-18: 2 mg via INTRAVENOUS

## 2021-04-18 MED ORDER — GLYCOPYRROLATE 0.2 MG/ML IJ SOLN
INTRAMUSCULAR | Status: AC
  Filled 2021-04-18: qty 1

## 2021-04-18 MED ORDER — PROPOFOL 500 MG/50ML IV EMUL
INTRAVENOUS | Status: DC | PRN
  Administered 2021-04-18: 120 ug/kg/min via INTRAVENOUS

## 2021-04-18 MED ORDER — PROPOFOL 500 MG/50ML IV EMUL
INTRAVENOUS | Status: AC
  Filled 2021-04-18: qty 50

## 2021-04-18 MED ORDER — LIDOCAINE 2% (20 MG/ML) 5 ML SYRINGE
INTRAMUSCULAR | Status: DC | PRN
  Administered 2021-04-18: 20 mg via INTRAVENOUS

## 2021-04-18 MED ORDER — SODIUM CHLORIDE 0.9 % IV SOLN
INTRAVENOUS | Status: DC
Start: 2021-04-18 — End: 2021-04-18

## 2021-04-18 NOTE — Anesthesia Preprocedure Evaluation (Signed)
Anesthesia Evaluation  Patient identified by MRN, date of birth, ID band Patient awake    Reviewed: Allergy & Precautions, H&P , NPO status , Patient's Chart, lab work & pertinent test results, reviewed documented beta blocker date and time   History of Anesthesia Complications Negative for: history of anesthetic complications  Airway Mallampati: II  TM Distance: >3 FB Neck ROM: full    Dental  (+) Dental Advidsory Given, Teeth Intact   Pulmonary neg shortness of breath, neg sleep apnea, neg COPD, neg recent URI, Current Smoker and Patient abstained from smoking.,    Pulmonary exam normal breath sounds clear to auscultation       Cardiovascular Exercise Tolerance: Good negative cardio ROS Normal cardiovascular exam Rhythm:regular Rate:Normal     Neuro/Psych negative neurological ROS  negative psych ROS   GI/Hepatic Neg liver ROS, GERD  Controlled,  Endo/Other  negative endocrine ROS  Renal/GU negative Renal ROS  negative genitourinary   Musculoskeletal   Abdominal   Peds  Hematology negative hematology ROS (+)   Anesthesia Other Findings History reviewed. No pertinent past medical history.   Reproductive/Obstetrics negative OB ROS                             Anesthesia Physical Anesthesia Plan  ASA: 2  Anesthesia Plan: General   Post-op Pain Management:    Induction: Intravenous  PONV Risk Score and Plan: 2 and TIVA and Propofol infusion  Airway Management Planned: Natural Airway and Nasal Cannula  Additional Equipment:   Intra-op Plan:   Post-operative Plan:   Informed Consent: I have reviewed the patients History and Physical, chart, labs and discussed the procedure including the risks, benefits and alternatives for the proposed anesthesia with the patient or authorized representative who has indicated his/her understanding and acceptance.     Dental Advisory  Given  Plan Discussed with: Anesthesiologist, CRNA and Surgeon  Anesthesia Plan Comments:         Anesthesia Quick Evaluation

## 2021-04-18 NOTE — Op Note (Signed)
[facility] Gastroenterology Patient Name: Alexis Zhang Procedure Date: 04/18/2021 7:34 AM MRN: [id number] Account #: [id number] Date of Birth: 22-May-1976 Admit Type: Outpatient Age: 45 Room: [facility] ENDO ROOM 2 Gender: Female Note Status: Finalized Procedure:             Colonoscopy Indications:           Rectal bleeding Providers:             Wyline Mood MD, MD Medicines:             Monitored Anesthesia Care Complications:         No immediate complications. Procedure:             Pre-Anesthesia Assessment:                        - Prior to the procedure, a History and Physical was                         performed, and patient medications, allergies and                         sensitivities were reviewed. The patient's tolerance                         of previous anesthesia was reviewed.                        - The risks and benefits of the procedure and the                         sedation options and risks were discussed with the                         patient. All questions were answered and informed                         consent was obtained.                        - ASA Grade Assessment: II - A patient with mild                         systemic disease.                        After obtaining informed consent, the colonoscope was                         passed under direct vision. Throughout the procedure,                         the patient's blood pressure, pulse, and oxygen                         saturations were monitored continuously. The                         Colonoscope was introduced through the anus and  advanced to the the terminal ileum. The colonoscopy                         was performed with ease. The patient tolerated the                         procedure well. The quality of the bowel preparation                         was excellent. Findings:      The perianal and digital rectal examinations were normal.       The terminal ileum appeared normal. Biopsies were taken with a cold       forceps for histology.      Multiple small-mouthed diverticula were found in the sigmoid colon.      The exam was otherwise without abnormality on direct and retroflexion       views.      Normal mucosa was found in the entire colon. Biopsies for histology were       taken with a cold forceps from the entire colon for evaluation of       microscopic colitis.      Non-bleeding internal hemorrhoids were found during retroflexion. The       hemorrhoids were medium-sized and Grade I (internal hemorrhoids that do       not prolapse). Impression:            - The examined portion of the ileum was normal.                         Biopsied.                        - Diverticulosis in the sigmoid colon.                        - The examination was otherwise normal on direct and                         retroflexion views.                        - Normal mucosa in the entire examined colon. Biopsied. Recommendation:        - Discharge patient to home (with escort).                        - Resume previous diet.                        - Continue present medications.                        - Await pathology results.                        - Return to my office as previously scheduled. Procedure Code(s):     --- Professional ---                        [phone], Colonoscopy, flexible; with biopsy, single or  multiple Diagnosis Code(s):     --- Professional ---                        K62.5, Hemorrhage of anus and rectum                        K57.30, Diverticulosis of large intestine without                         perforation or abscess without bleeding CPT copyright 2019 American Medical Association. All rights reserved. The codes documented in this report are preliminary and upon coder review may  be revised to meet current compliance requirements. Wyline Mood, MD Wyline Mood MD, MD 04/18/2021 8:07:45  AM This report has been signed electronically. Number of Addenda: 0 Note Initiated On: 04/18/2021 7:34 AM Scope Withdrawal Time: 0 hours 10 minutes 38 seconds  Total Procedure Duration: 0 hours 16 minutes 45 seconds  Estimated Blood Loss:  Estimated blood loss: none.      [facility]

## 2021-04-18 NOTE — Transfer of Care (Signed)
Immediate Anesthesia Transfer of Care Note  Patient: Alexis Zhang  Procedure(s) Performed: COLONOSCOPY WITH PROPOFOL  Patient Location: Endoscopy Unit  Anesthesia Type:General  Level of Consciousness: awake and alert   Airway & Oxygen Therapy: Patient Spontanous Breathing  Post-op Assessment: Report given to RN and Post -op Vital signs reviewed and stable  Post vital signs: Reviewed  Last Vitals:  Vitals Value Taken Time  BP 114/78 04/18/21 0809  Temp    Pulse 89 04/18/21 0808  Resp 14 04/18/21 0808  SpO2 98 % 04/18/21 0808  Vitals shown include unvalidated device data.  Last Pain:  Vitals:   04/18/21 0656  TempSrc: Temporal  PainSc: 0-No pain         Complications: No notable events documented.

## 2021-04-18 NOTE — Anesthesia Postprocedure Evaluation (Signed)
Anesthesia Post Note  Patient: Alexis Zhang  Procedure(s) Performed: COLONOSCOPY WITH PROPOFOL  Patient location during evaluation: Endoscopy Anesthesia Type: General Level of consciousness: awake and alert Pain management: pain level controlled Vital Signs Assessment: post-procedure vital signs reviewed and stable Respiratory status: spontaneous breathing, nonlabored ventilation, respiratory function stable and patient connected to nasal cannula oxygen Cardiovascular status: blood pressure returned to baseline and stable Postop Assessment: no apparent nausea or vomiting Anesthetic complications: no   No notable events documented.   Last Vitals:  Vitals:   04/18/21 0656 04/18/21 0809  BP: (!) 145/94 114/78  Pulse: 90   Resp: 17   Temp: (!) 36.2 C (!) 36.3 C  SpO2: 97%     Last Pain:  Vitals:   04/18/21 0819  TempSrc:   PainSc: 0-No pain                 Lenard Simmer

## 2021-04-18 NOTE — H&P (Signed)
Jonathon Bellows, MD [address] [place], [place], [place], [address] [address], [place], [place], [place], [address] Phone: [phone]  Fax: [phone]  Primary Care Physician:  Beckie Salts, FNP   Pre-Procedure History & Physical: HPI:  Alexis Zhang is a 45 y.o. female is here for an colonoscopy.   History reviewed. No pertinent past medical history.  Past Surgical History:  Procedure Laterality Date   ABDOMINAL HYSTERECTOMY     KNEE SURGERY     PARTIAL HYSTERECTOMY      Prior to Admission medications   Medication Sig Start Date End Date Taking? Authorizing Provider  acetaminophen (TYLENOL) 500 MG tablet Take 500 mg by mouth daily as needed.   Yes [provider]  estradiol (ESTRACE) 0.1 MG/GM vaginal cream 1/4 app per vagina 1-2 times weekly 03/29/21  Yes [provider]  traZODone (DESYREL) 50 MG tablet Take 0.5-1 tablets (25-50 mg total) by mouth at bedtime as needed for sleep. 11/17/20  Yes Beckie Salts, FNP  Sodium Sulfate-Mag Sulfate-KCl (SUTAB) [phone] MG TABS At 5 PM take 12 tablets using the 8 oz cup provided in the kit drinking 5 cups of water and 5 hours before your procedure repeat the same process. 04/13/21   Jonathon Bellows, MD    Allergies as of 04/13/2021   (No Known Allergies)    Family History  Problem Relation Age of Onset   Heart disease Mother    Hypertension Mother    Thyroid disease Mother    Diabetes Father    Diabetes Brother    Diabetes Maternal Grandfather    Diabetes Paternal Grandmother    Breast cancer Other        Mgreat-aunt    Social History   Socioeconomic History   Marital status: Married    Spouse name: Not on file   Number of children: Not on file   Years of education: Not on file   Highest education level: Not on file  Occupational History   Not on file  Tobacco Use   Smoking status: Every Day    Packs/day: 0.50    Years: 20.00    Pack years: 10.00    Types: Cigarettes    Smokeless tobacco: Never  Vaping Use   Vaping Use: Never used  Substance and Sexual Activity   Alcohol use: No    Comment: Social drinks   Drug use: Yes    Types: Marijuana    Comment: Once about every 6 months   Sexual activity: Yes  Other Topics Concern   Not on file  Social History Narrative   Not on file   Social Determinants of Health   Financial Resource Strain: Not on file  Food Insecurity: Not on file  Transportation Needs: Not on file  Physical Activity: Not on file  Stress: Not on file  Social Connections: Not on file  Intimate Partner Violence: Not on file    Review of Systems: See HPI, otherwise negative ROS  Physical Exam: BP (!) 145/94   Pulse 90   Temp (!) 97.1 F (36.2 C) (Temporal)   Resp 17   Ht 6' (1.829 m)   Wt 111.6 kg   SpO2 97%   BMI 33.36 kg/m  General:   Alert,  pleasant and cooperative in NAD Head:  Normocephalic and atraumatic. Neck:  Supple; no masses or thyromegaly. Lungs:  Clear throughout to auscultation, normal respiratory effort.    Heart:  +S1, +S2, Regular rate and rhythm, No  edema. Abdomen:  Soft, nontender and nondistended. Normal bowel sounds, without guarding, and without rebound.   Neurologic:  Alert and  oriented x4;  grossly normal neurologically.  Impression/Plan: Alexis Zhang is here for an colonoscopy to be performed for rectal bleeding .   Risks, benefits, limitations, and alternatives regarding  colonoscopy have been reviewed with the patient.  Questions have been answered.  All parties agreeable.   Jonathon Bellows, MD  04/18/2021, 7:44 AM

## 2021-04-19 ENCOUNTER — Encounter: Payer: Self-pay | Admitting: Gastroenterology

## 2021-04-19 LAB — CBC WITH DIFFERENTIAL/PLATELET
Basophils Absolute: 0.1 10*3/uL (ref 0.0–0.2)
Basos: 1 %
EOS (ABSOLUTE): 0.2 10*3/uL (ref 0.0–0.4)
Eos: 2 %
Hematocrit: 41.2 % (ref 34.0–46.6)
Hemoglobin: 13.6 g/dL (ref 11.1–15.9)
Immature Grans (Abs): 0.1 10*3/uL (ref 0.0–0.1)
Immature Granulocytes: 1 %
Lymphocytes Absolute: 3 10*3/uL (ref 0.7–3.1)
Lymphs: 28 %
MCH: 29.9 pg (ref 26.6–33.0)
MCHC: 33 g/dL (ref 31.5–35.7)
MCV: 91 fL (ref 79–97)
Monocytes Absolute: 0.6 10*3/uL (ref 0.1–0.9)
Monocytes: 5 %
Neutrophils Absolute: 6.7 10*3/uL (ref 1.4–7.0)
Neutrophils: 63 %
Platelets: 301 10*3/uL (ref 150–450)
RBC: 4.55 x10E6/uL (ref 3.77–5.28)
RDW: 13.1 % (ref 11.7–15.4)
WBC: 10.6 10*3/uL (ref 3.4–10.8)

## 2021-04-19 LAB — COMPREHENSIVE METABOLIC PANEL
ALT: 13 IU/L (ref 0–32)
AST: 12 IU/L (ref 0–40)
Albumin/Globulin Ratio: 2.1 (ref 1.2–2.2)
Albumin: 4.6 g/dL (ref 3.8–4.8)
Alkaline Phosphatase: 68 IU/L (ref 44–121)
BUN/Creatinine Ratio: 15 (ref 9–23)
BUN: 13 mg/dL (ref 6–24)
Bilirubin Total: <0.2 mg/dL (ref 0.0–1.2)
CO2: 25 mmol/L (ref 20–29)
Calcium: 9.6 mg/dL (ref 8.7–10.2)
Chloride: 101 mmol/L (ref 96–106)
Creatinine, Ser: 0.87 mg/dL (ref 0.57–1.00)
Globulin, Total: 2.2 g/dL (ref 1.5–4.5)
Glucose: 108 mg/dL — ABNORMAL HIGH (ref 65–99)
Potassium: 4.4 mmol/L (ref 3.5–5.2)
Sodium: 141 mmol/L (ref 134–144)
Total Protein: 6.8 g/dL (ref 6.0–8.5)
eGFR: 85 mL/min/{1.73_m2} (ref >59)

## 2021-04-19 LAB — ALPHA-GAL PANEL
Allergen Lamb IgE: <0.1 kU/L
Beef IgE: <0.1 kU/L
IgE (Immunoglobulin E), Serum: <2 IU/mL — ABNORMAL LOW (ref 6–495)
O215-IgE Alpha-Gal: <0.1 kU/L
Pork IgE: <0.1 kU/L

## 2021-04-19 LAB — SURGICAL PATHOLOGY

## 2021-04-19 LAB — C-REACTIVE PROTEIN: CRP: 17 mg/L — ABNORMAL HIGH (ref 0–10)

## 2021-04-26 ENCOUNTER — Telehealth: Payer: Self-pay

## 2021-04-26 DIAGNOSIS — R7982 Elevated C-reactive protein (CRP): Secondary | ICD-10-CM

## 2021-04-26 NOTE — Telephone Encounter (Signed)
-----   Message from Wyline Mood, MD sent at 04/25/2021 12:48 PM EDT ----- Biopsies were normal from colonoscopuy , if still has diarrhea to come and follow up

## 2021-04-26 NOTE — Telephone Encounter (Signed)
Sent patient a mychart message with results.

## 2021-04-26 NOTE — Telephone Encounter (Signed)
-----   Message from Wyline Mood, MD sent at 04/26/2021  9:12 AM EDT ----- Inform all labs are normal except CRP which is elevated which may be related to inflammation in the colon due to chronic NSAID use.  Inform we will recheck it 6 weeks after she has stopped all NSAIDs.  Can place an order and have her get the labs done in 6 weeks for a CRP please

## 2021-04-26 NOTE — Telephone Encounter (Signed)
Order CRP in 6 weeks. Sent patient a Clinical cytogeneticist message with results

## 2021-05-30 ENCOUNTER — Encounter: Payer: Self-pay | Admitting: Gastroenterology

## 2021-05-30 ENCOUNTER — Telehealth: Payer: 59 | Admitting: Gastroenterology

## 2021-05-30 NOTE — Progress Notes (Unsigned)
Alexis Zhang , MD [address]  [place]  [place], [place] [address]  Main: [phone]  Fax: [phone]   Primary Care Physician: Beckie Salts, [place]  Virtual Visit via Video Note  I connected with patient on 05/30/21 at  1:45 PM EDT by video and verified that I am speaking with the correct person using two identifiers.   I discussed the limitations, risks, security and privacy concerns of performing an evaluation and management service by video  and the availability of in person appointments. I also discussed with the patient that there may be a patient responsible charge related to this service. The patient expressed understanding and agreed to proceed.  Location of Patient: Home Location of Provider: Home Persons involved: Patient and provider only   History of Present Illness:   Chief complaint: Follow-up for diarrhea   HPI: Alexis Zhang is a 45 y.o. female  Summary of history :  Initially referred and seen on 04/13/2021 for diarrhea ongoing since 2018.  History of normal bowel movements for weeks followed by diarrhea for a day to continue to 4 bowel movements.  Ranging from soft pudding-like consistency to watery in nature.  No weight loss, no change in shape of stool.  Consumes a lot of sweet and low and diet soda on a regular basis.  Unable to tolerate red meat causing significant abdominal symptoms and had been using BC powder on a daily basis for neck pain.    Interval history 04/13/2021-05/30/2021   04/13/2021: CRP 17, CMP normal, hemoglobin 13.6 g.  Alpha gal panel showed no abnormality plan  04/18/2021: Colonoscopy: Terminal ileum appeared normal diverticulosis of the sigmoid colon noted nonbleeding internal hemorrhoids found.  Terminal ileal biopsies were normal.  Random colon biopsies showed no evidence of microscopic colitis ***       Current Outpatient Medications  Medication Sig Dispense Refill   acetaminophen (TYLENOL) 500 MG tablet Take  500 mg by mouth daily as needed.     estradiol (ESTRACE) 0.1 MG/GM vaginal cream 1/4 app per vagina 1-2 times weekly     Sodium Sulfate-Mag Sulfate-KCl (SUTAB) [phone] MG TABS At 5 PM take 12 tablets using the 8 oz cup provided in the kit drinking 5 cups of water and 5 hours before your procedure repeat the same process. 24 tablet 0   traZODone (DESYREL) 50 MG tablet Take 0.5-1 tablets (25-50 mg total) by mouth at bedtime as needed for sleep. 90 tablet 1   No current facility-administered medications for this visit.    Allergies as of 05/30/2021   (No Known Allergies)    Review of Systems:    All systems reviewed and negative except where noted in HPI.  General Appearance:    Alert, cooperative, no distress, appears stated age  Head:    Normocephalic, without obvious abnormality, atraumatic  Eyes:    PERRL, conjunctiva/corneas clear,  Ears:    Grossly normal hearing    Neurologic:  Grossly normal    Observations/Objective:  Labs: CMP     Component Value Date/Time   NA 141 04/13/2021 1406   K 4.4 04/13/2021 1406   CL 101 04/13/2021 1406   CO2 25 04/13/2021 1406   GLUCOSE 108 (H) 04/13/2021 1406   GLUCOSE 126 (H) 03/09/2021 0941   BUN 13 04/13/2021 1406   CREATININE 0.87 04/13/2021 1406   CREATININE 0.77 03/09/2021 0941   CALCIUM 9.6 04/13/2021 1406   PROT 6.8 04/13/2021 1406   ALBUMIN 4.6 04/13/2021 1406  AST 12 04/13/2021 1406   ALT 13 04/13/2021 1406   ALKPHOS 68 04/13/2021 1406   BILITOT <0.2 04/13/2021 1406   GFRNONAA 95 03/09/2021 0941   GFRAA 110 03/09/2021 0941   Lab Results  Component Value Date   WBC 10.6 04/13/2021   HGB 13.6 04/13/2021   HCT 41.2 04/13/2021   MCV 91 04/13/2021   PLT 301 04/13/2021    Imaging Studies: No results found.  Assessment and Plan:   Alexis Zhang is a 45 y.o. y/o female here to follow-up for diarrhea.  History of use of artificial sweeteners on a daily basis along with BC powders.  Differentials include NSAID  related colitis versus diarrhea secondary to osmotic action of artificial sugars.  History of rectal bleeding likely due to internal hemorrhoids.       I discussed the assessment and treatment plan with the patient. The patient was provided an opportunity to ask questions and all were answered. The patient agreed with the plan and demonstrated an understanding of the instructions.   The patient was advised to call back or seek an in-person evaluation if the symptoms worsen or if the condition fails to improve as anticipated.  I provided *** minutes of face-to-face time during this encounter.  Dr Alexis Bellows MD,MRCP [facility]) Gastroenterology/Hepatology Pager: [phone]   Speech recognition software was used to dictate this note.

## 2021-05-31 ENCOUNTER — Ambulatory Visit: Payer: 59 | Admitting: Gastroenterology

## 2021-08-07 IMAGING — US US BREAST*L* LIMITED INC AXILLA
1 series · 9 of 9 positions shown · non-contrast
Comparison: Previous baseline exam.

CLINICAL DATA: Callback for bilateral asymmetries.  Baseline exam.

EXAM:
DIGITAL DIAGNOSTIC BILATERAL MAMMOGRAM WITH TOMOSYNTHESIS AND CAD;
ULTRASOUND LEFT BREAST LIMITED; ULTRASOUND RIGHT BREAST LIMITED
TECHNIQUE: Bilateral digital diagnostic mammography and breast tomosynthesis
was performed. The images were evaluated with computer-aided
detection.; Targeted ultrasound examination of the left breast was
performed; Targeted ultrasound examination of the right breast was
performed

[Series 1: us breast*left* limited inc axilla · 0.06mm/px · 9 of 9 slices shown]
[im 1/9]
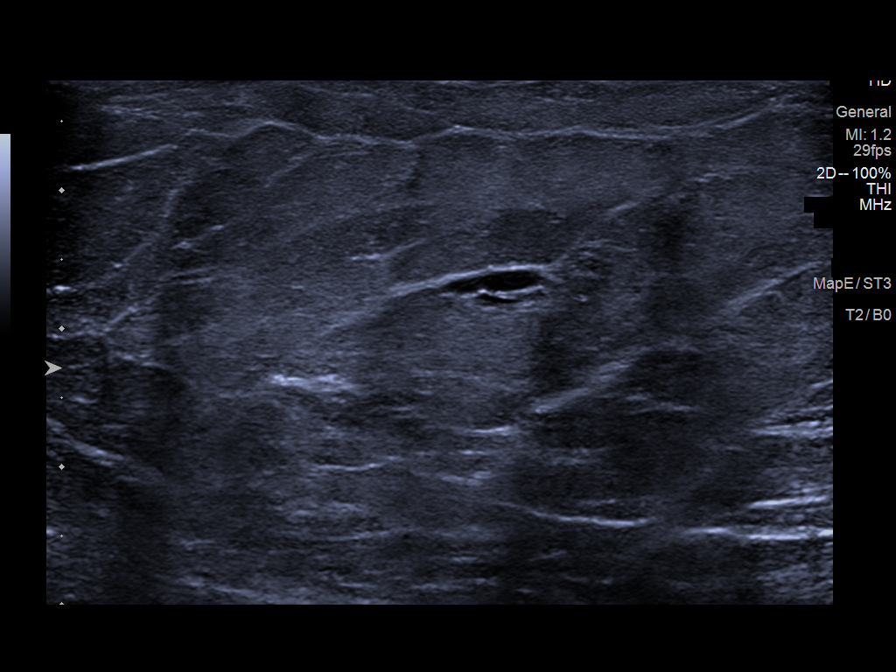
[im 2/9]
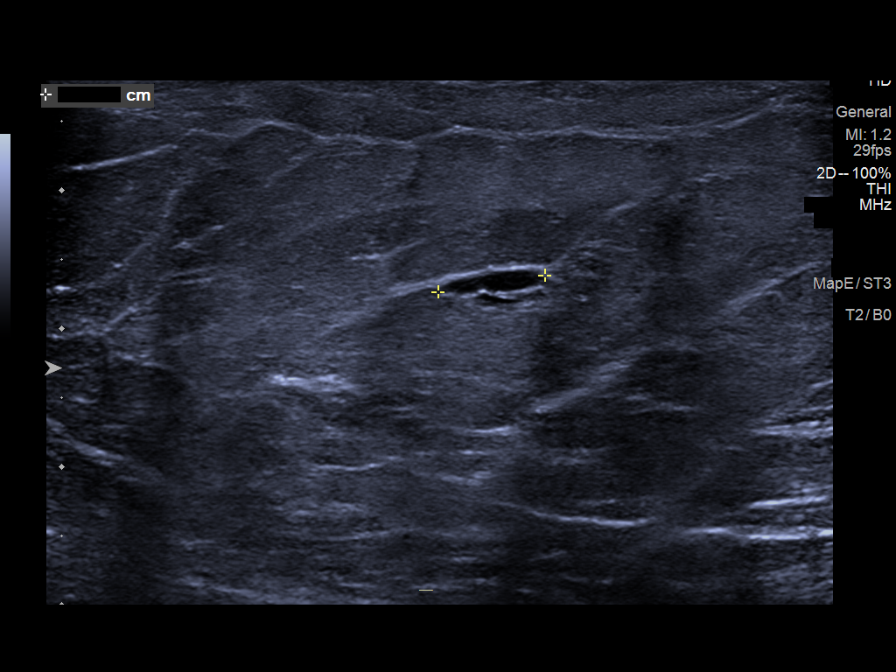
[im 3/9]
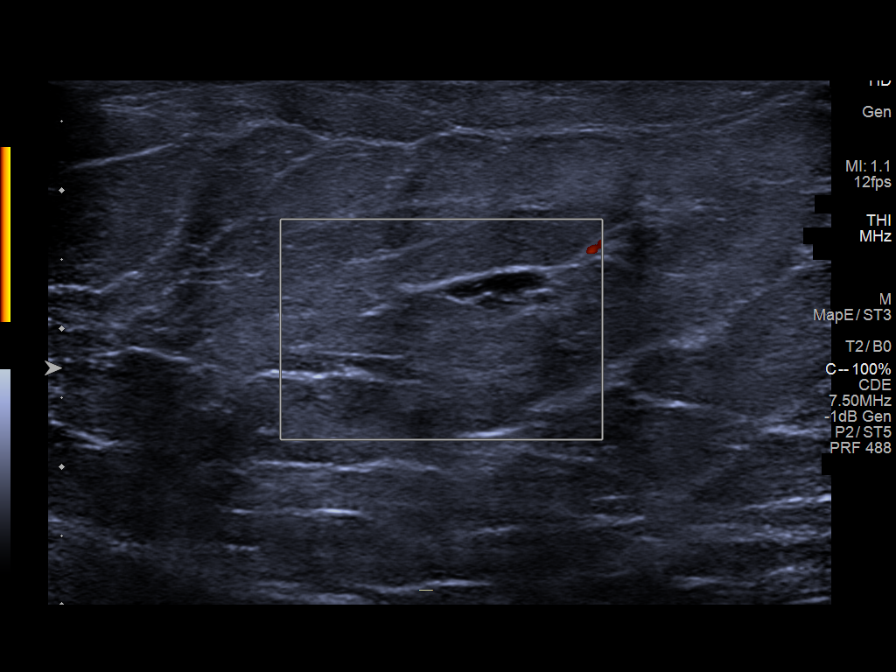
[im 4/9]
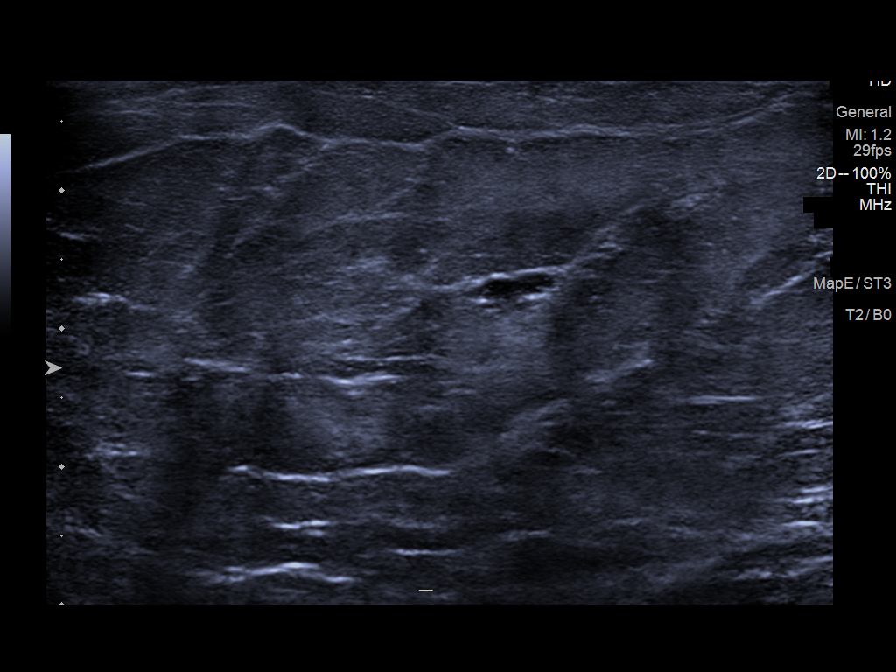
[im 5/9]
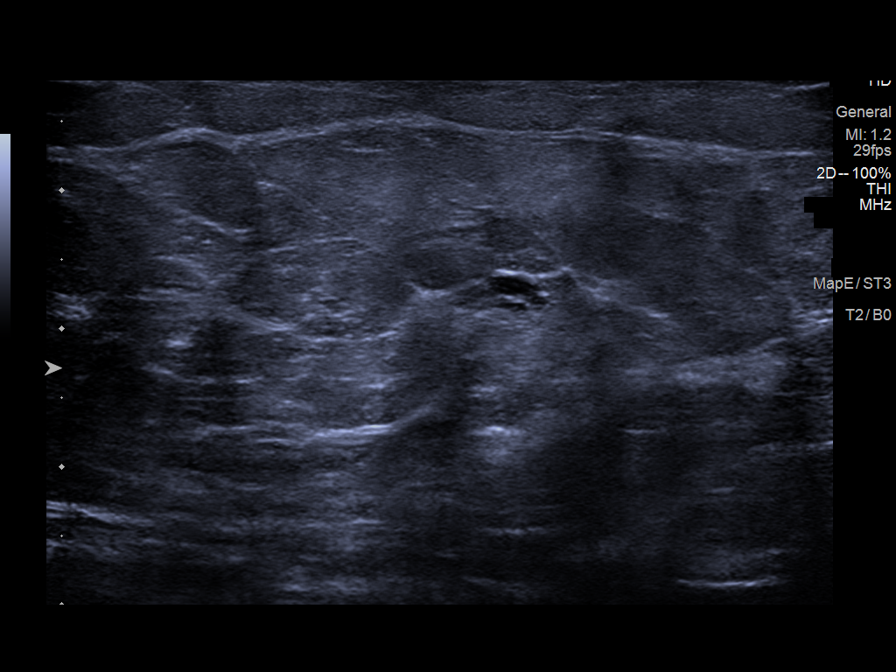
[im 6/9]
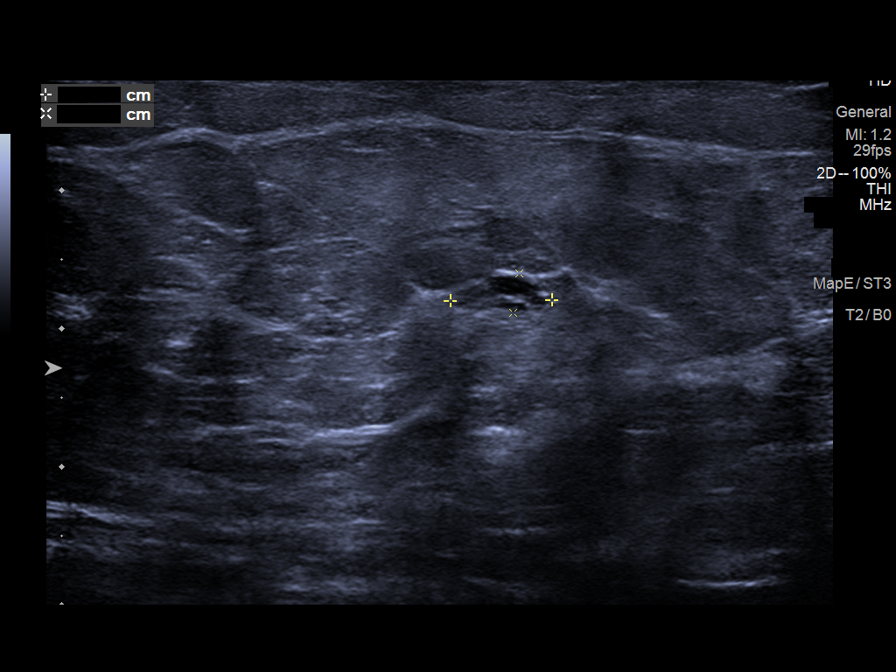
[im 7/9]
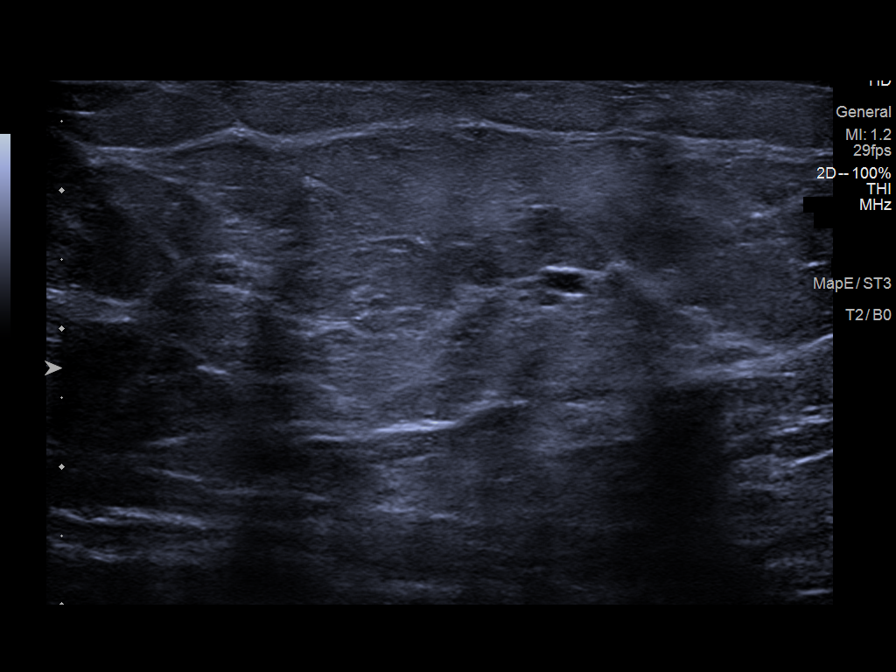
[im 8/9]
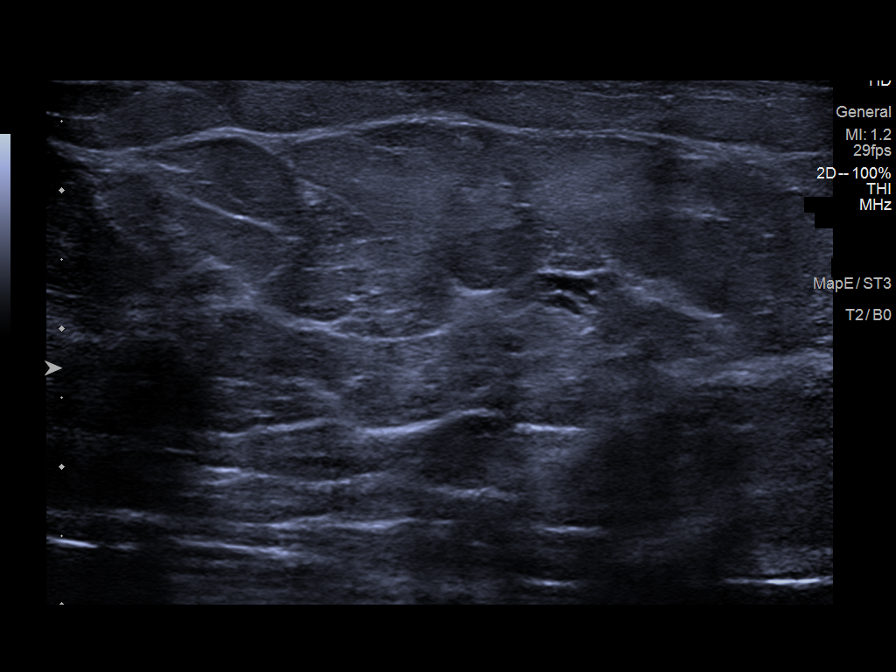
[im 9/9]
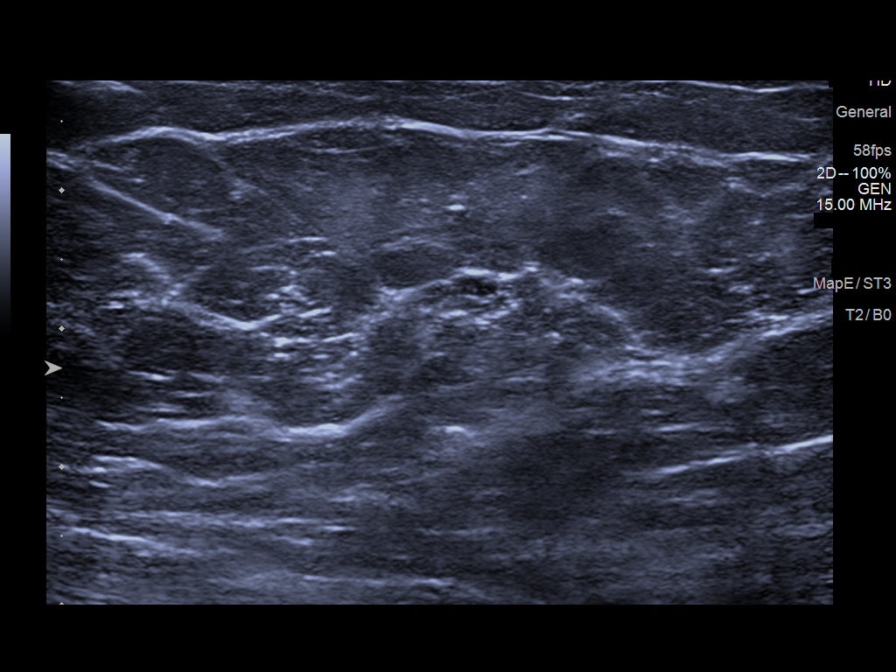

[9 of 9 positions shown; findings below may reference images not displayed]

ACR Breast Density Category c: The breast tissue is heterogeneously
dense, which may obscure small masses.
FINDINGS: Spot compression tomosynthesis views confirm persistence of an oval
circumscribed mass in the RIGHT upper outer breast at middle depth.
No additional suspicious findings noted within the RIGHT breast.

Spot compression tomosynthesis views confirm persistence of an oval
circumscribed mass with suggestion of layering calcifications in the
upper breast at middle depth. This is best seen on ML slice 57.
Second questioned asymmetry in the inner breast resolves with
additional views, consistent with overlapping tissue.

On physical exam, no suspicious mass is appreciated.

Targeted ultrasound was performed of the RIGHT upper outer breast.
At 11 o'clock 4 cm from the nipple, there is an oval circumscribed
anechoic mass with posterior acoustic enhancement. It has several
thin internal septations. It measures 10 by 8 x 7 mm and is
consistent with a benign cluster of cysts. This corresponds to the
site of screening mammographic concern.

Targeted ultrasound was performed of the LEFT upper breast. At 12
o'clock 9 cm from the nipple, there is an oval circumscribed
anechoic mass with posterior acoustic enhancement and several thin
internal septations. It measures 7 x 3 by 8 mm and is consistent
with a benign cluster of cysts. This corresponds to the site of
screening mammographic concern.
IMPRESSION: There are benign bilateral cysts at the sites of screening
mammographic concern. No mammographic or sonographic evidence of
malignancy

RECOMMENDATION:
Screening mammogram in one year.(Code:PD-S-XV0)

I have discussed the findings and recommendations with the patient.
If applicable, a reminder letter will be sent to the patient
regarding the next appointment.

BI-RADS CATEGORY  2: Benign.

## 2021-08-07 IMAGING — MG DIGITAL DIAGNOSTIC BILAT W/ TOMO W/ CAD
6 of 10 series · 6 of 30 positions shown · non-contrast
Comparison: Previous baseline exam.

CLINICAL DATA: Callback for bilateral asymmetries.  Baseline exam.

EXAM:
DIGITAL DIAGNOSTIC BILATERAL MAMMOGRAM WITH TOMOSYNTHESIS AND CAD;
ULTRASOUND LEFT BREAST LIMITED; ULTRASOUND RIGHT BREAST LIMITED
TECHNIQUE: Bilateral digital diagnostic mammography and breast tomosynthesis
was performed. The images were evaluated with computer-aided
detection.; Targeted ultrasound examination of the left breast was
performed; Targeted ultrasound examination of the right breast was
performed

[L ML synth-2D]
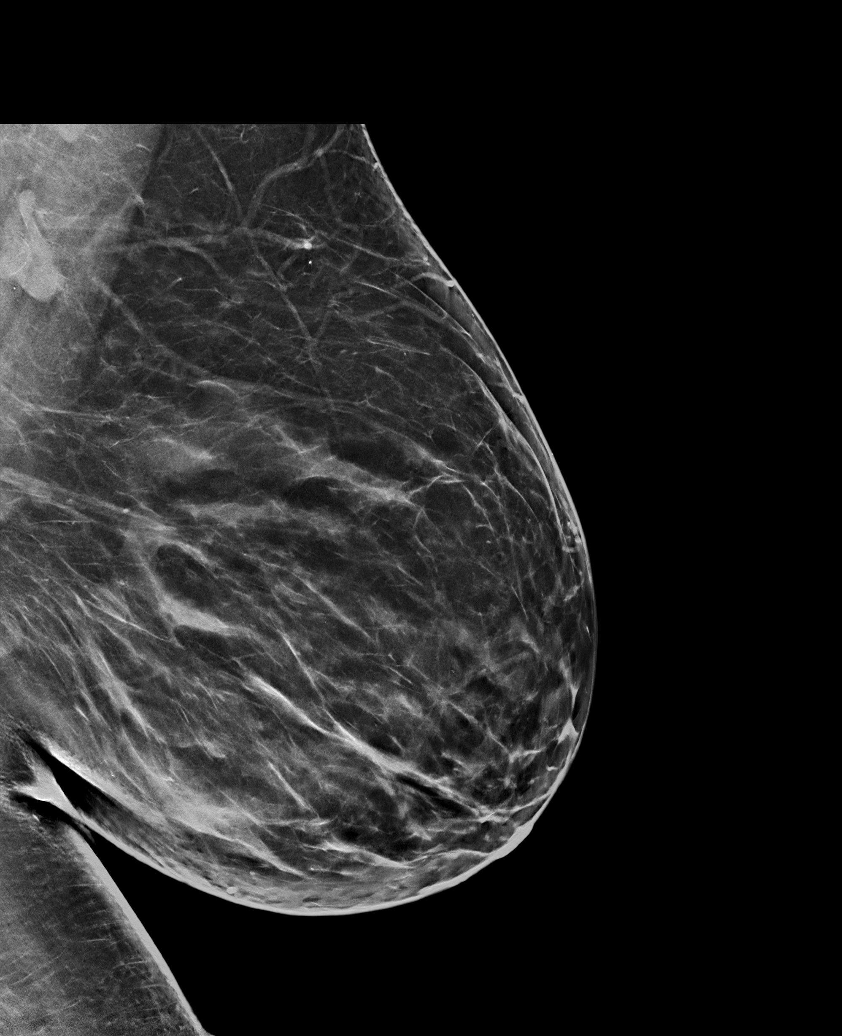

[R CC synth-2D]
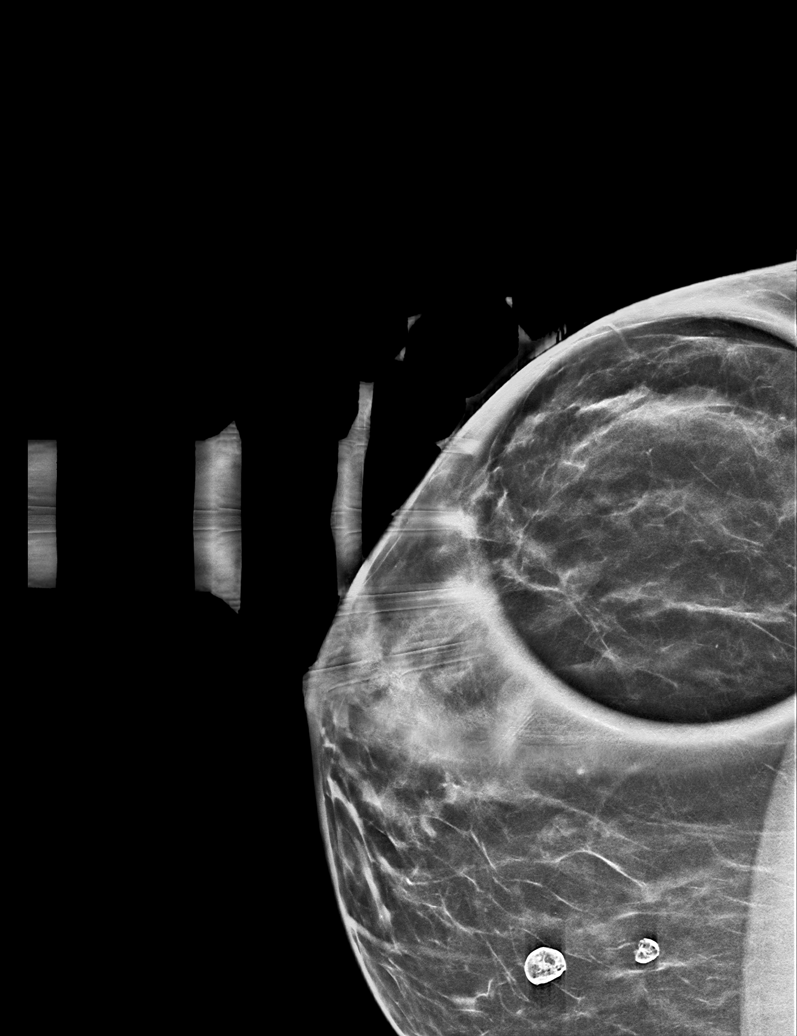

[L MLO synth-2D]
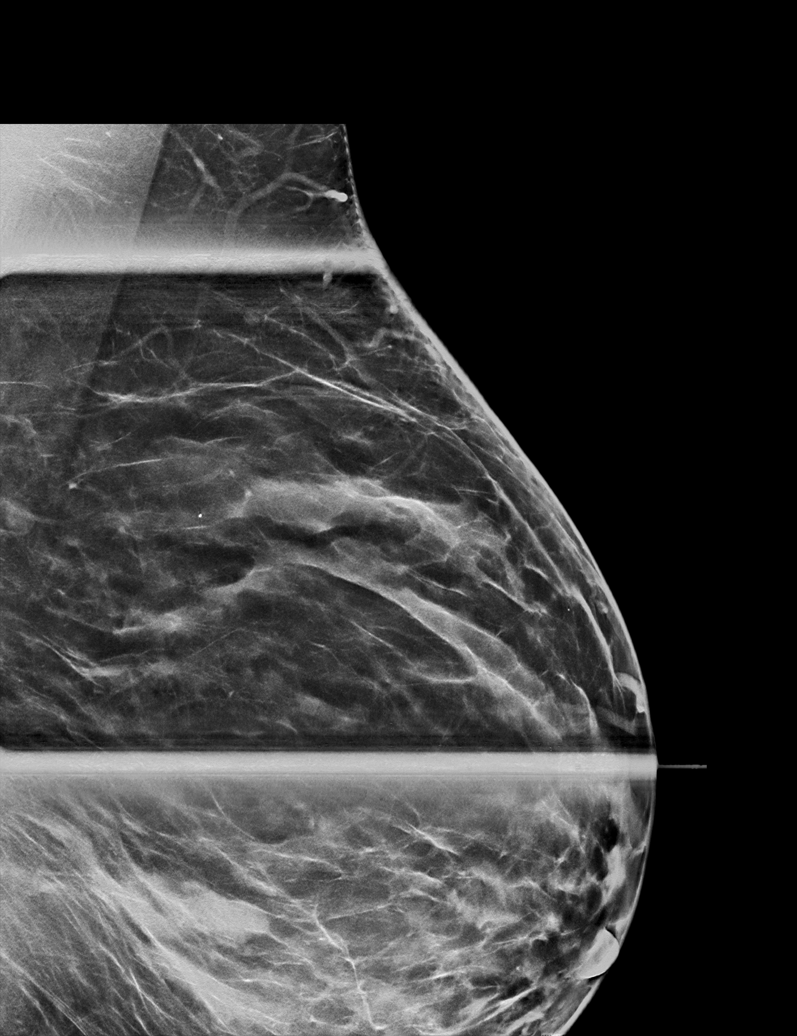

[R MLO synth-2D]
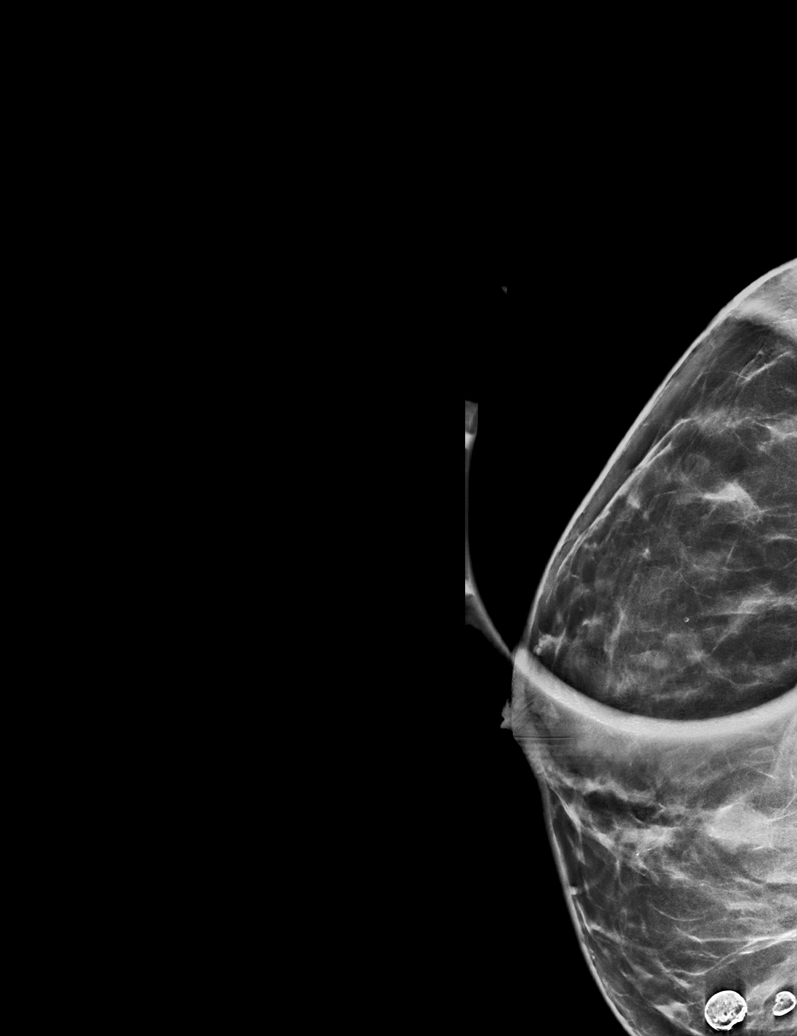

[L CC synth-2D]
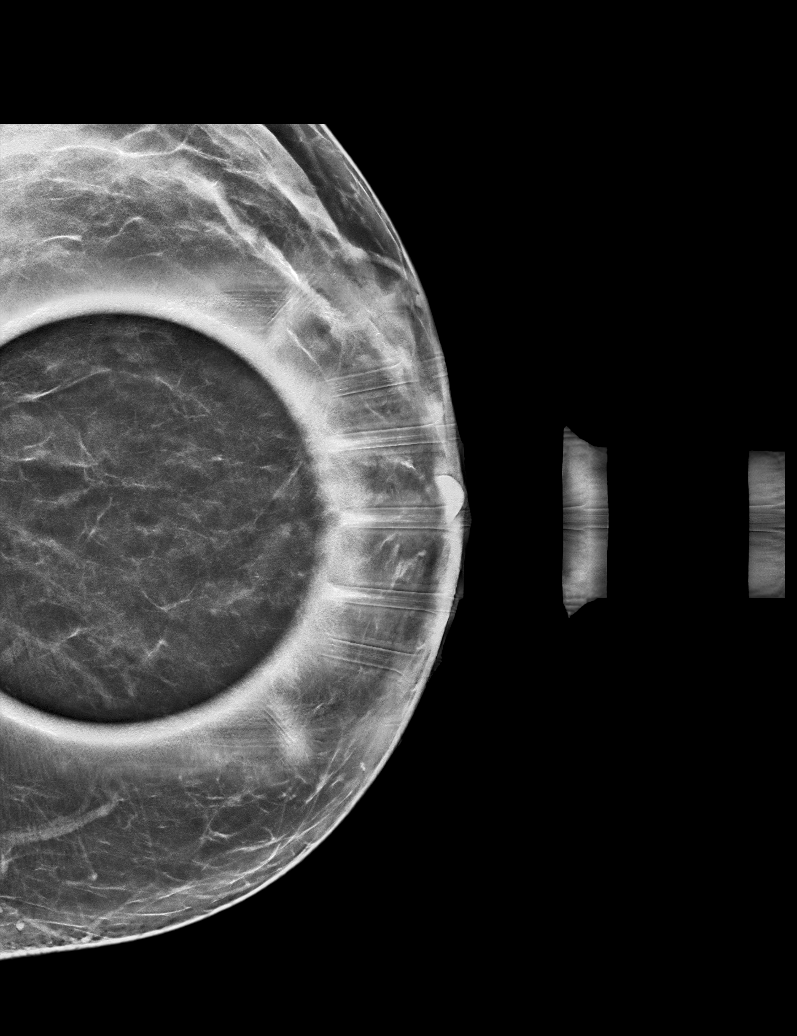

[L MLO tomo · tomo slice 37/73.0]
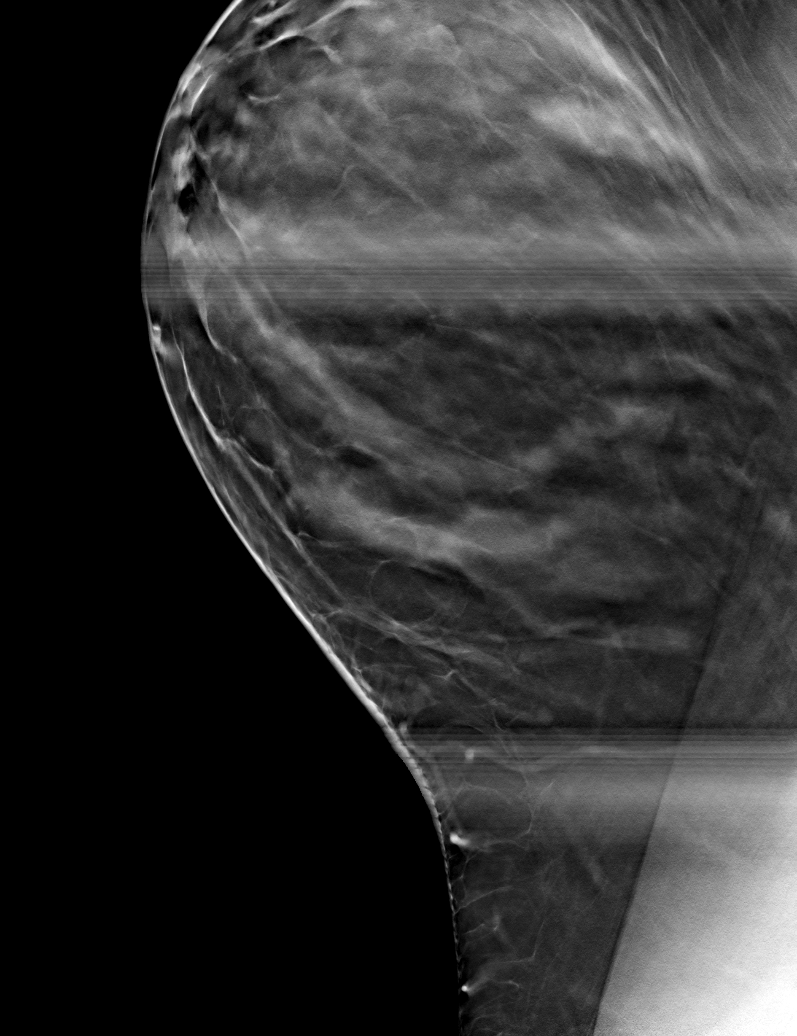

[6 of 30 positions shown; findings below may reference images not displayed]

ACR Breast Density Category c: The breast tissue is heterogeneously
dense, which may obscure small masses.
FINDINGS: Spot compression tomosynthesis views confirm persistence of an oval
circumscribed mass in the RIGHT upper outer breast at middle depth.
No additional suspicious findings noted within the RIGHT breast.

Spot compression tomosynthesis views confirm persistence of an oval
circumscribed mass with suggestion of layering calcifications in the
upper breast at middle depth. This is best seen on ML slice 57.
Second questioned asymmetry in the inner breast resolves with
additional views, consistent with overlapping tissue.

On physical exam, no suspicious mass is appreciated.

Targeted ultrasound was performed of the RIGHT upper outer breast.
At 11 o'clock 4 cm from the nipple, there is an oval circumscribed
anechoic mass with posterior acoustic enhancement. It has several
thin internal septations. It measures 10 by 8 x 7 mm and is
consistent with a benign cluster of cysts. This corresponds to the
site of screening mammographic concern.

Targeted ultrasound was performed of the LEFT upper breast. At 12
o'clock 9 cm from the nipple, there is an oval circumscribed
anechoic mass with posterior acoustic enhancement and several thin
internal septations. It measures 7 x 3 by 8 mm and is consistent
with a benign cluster of cysts. This corresponds to the site of
screening mammographic concern.
IMPRESSION: There are benign bilateral cysts at the sites of screening
mammographic concern. No mammographic or sonographic evidence of
malignancy

RECOMMENDATION:
Screening mammogram in one year.(Code:PD-S-XV0)

I have discussed the findings and recommendations with the patient.
If applicable, a reminder letter will be sent to the patient
regarding the next appointment.

BI-RADS CATEGORY  2: Benign.

## 2021-10-31 ENCOUNTER — Encounter: Payer: Self-pay | Admitting: Internal Medicine

## 2022-07-16 ENCOUNTER — Other Ambulatory Visit: Payer: Self-pay | Admitting: Family Medicine

## 2022-07-16 DIAGNOSIS — Z1231 Encounter for screening mammogram for malignant neoplasm of breast: Secondary | ICD-10-CM

## 2022-08-06 ENCOUNTER — Ambulatory Visit
Admission: RE | Admit: 2022-08-06 | Discharge: 2022-08-06 | Disposition: A | Discharge status: Home / Self Care | Payer: 59 | Source: Ambulatory Visit | Attending: Family Medicine | Admitting: Family Medicine

## 2022-08-06 DIAGNOSIS — Z1231 Encounter for screening mammogram for malignant neoplasm of breast: Secondary | ICD-10-CM | POA: Diagnosis present

## 2023-07-02 ENCOUNTER — Other Ambulatory Visit: Payer: Self-pay | Admitting: Family Medicine

## 2023-07-02 DIAGNOSIS — Z1231 Encounter for screening mammogram for malignant neoplasm of breast: Secondary | ICD-10-CM

## 2023-08-12 ENCOUNTER — Ambulatory Visit
Admission: RE | Admit: 2023-08-12 | Discharge: 2023-08-12 | Disposition: A | Discharge status: Home / Self Care | Payer: 59 | Source: Ambulatory Visit | Attending: Family Medicine | Admitting: Family Medicine

## 2023-08-12 DIAGNOSIS — Z1231 Encounter for screening mammogram for malignant neoplasm of breast: Secondary | ICD-10-CM | POA: Insufficient documentation

## 2024-07-24 ENCOUNTER — Other Ambulatory Visit: Payer: Self-pay | Admitting: Medical Genetics

## 2024-08-01 ENCOUNTER — Other Ambulatory Visit
Admission: RE | Admit: 2024-08-01 | Discharge: 2024-08-01 | Disposition: A | Discharge status: Home / Self Care | Payer: Self-pay | Source: Ambulatory Visit | Attending: Medical Genetics | Admitting: Medical Genetics

## 2024-08-11 LAB — GENECONNECT MOLECULAR SCREEN: Genetic Analysis Overall Interpretation: NEGATIVE

## 2024-08-31 ENCOUNTER — Other Ambulatory Visit: Payer: Self-pay | Admitting: Obstetrics and Gynecology

## 2024-08-31 DIAGNOSIS — Z1231 Encounter for screening mammogram for malignant neoplasm of breast: Secondary | ICD-10-CM

## 2024-10-05 ENCOUNTER — Ambulatory Visit
Admission: RE | Admit: 2024-10-05 | Discharge: 2024-10-05 | Disposition: A | Source: Ambulatory Visit | Attending: Obstetrics and Gynecology | Admitting: Obstetrics and Gynecology

## 2024-10-05 DIAGNOSIS — Z1231 Encounter for screening mammogram for malignant neoplasm of breast: Secondary | ICD-10-CM | POA: Diagnosis present
